# Patient Record
Sex: Male | Born: 1945 | Race: White | Hispanic: No | Marital: Single | Smoking: Never smoker
Health system: Southern US, Community
[De-identification: ages and names within clinical notes are randomized; demographics above are authoritative.]

## PROBLEM LIST (undated history)

## (undated) DIAGNOSIS — E079 Disorder of thyroid, unspecified: Secondary | ICD-10-CM

## (undated) DIAGNOSIS — I10 Essential (primary) hypertension: Secondary | ICD-10-CM

## (undated) DIAGNOSIS — E119 Type 2 diabetes mellitus without complications: Secondary | ICD-10-CM

## (undated) HISTORY — DX: Type 2 diabetes mellitus without complications: E11.9

## (undated) HISTORY — PX: OTHER SURGICAL HISTORY: SHX169

## (undated) HISTORY — DX: Disorder of thyroid, unspecified: E07.9

---

## 2013-03-09 ENCOUNTER — Other Ambulatory Visit: Payer: Self-pay

## 2013-03-09 MED ORDER — METFORMIN HCL 1000 MG PO TABS: 1000.0000 mg | ORAL_TABLET | Freq: Two times a day (BID) | ORAL | Status: DC

## 2013-03-09 NOTE — Telephone Encounter (Signed)
Last seen 07/24/12  [facility]  Last glucose leve l6/18/12

## 2013-03-22 ENCOUNTER — Ambulatory Visit (INDEPENDENT_AMBULATORY_CARE_PROVIDER_SITE_OTHER): Payer: Managed Care, Other (non HMO) | Admitting: Family Medicine

## 2013-03-22 ENCOUNTER — Encounter: Payer: Self-pay | Admitting: Family Medicine

## 2013-03-22 VITALS — BP 138/85 | HR 74 | Temp 98.0°F | Ht 68.5 in | Wt 196.4 lb

## 2013-03-22 DIAGNOSIS — I10 Essential (primary) hypertension: Secondary | ICD-10-CM

## 2013-03-22 DIAGNOSIS — E119 Type 2 diabetes mellitus without complications: Secondary | ICD-10-CM

## 2013-03-22 DIAGNOSIS — Z0000 Encounter for general adult medical examination without abnormal findings: Secondary | ICD-10-CM

## 2013-03-22 DIAGNOSIS — E039 Hypothyroidism, unspecified: Secondary | ICD-10-CM

## 2013-03-22 LAB — LIPID PANEL
Cholesterol: 134 mg/dL (ref 0–200)
HDL: 44 mg/dL (ref >39)
LDL Cholesterol: 64 mg/dL (ref 0–99)
Total CHOL/HDL Ratio: 3 Ratio
Triglycerides: 131 mg/dL (ref <150)
VLDL: 26 mg/dL (ref 0–40)

## 2013-03-22 LAB — COMPLETE METABOLIC PANEL WITH GFR
ALT: 59 U/L — ABNORMAL HIGH (ref 0–53)
AST: 45 U/L — ABNORMAL HIGH (ref 0–37)
Albumin: 4.3 g/dL (ref 3.5–5.2)
Alkaline Phosphatase: 44 U/L (ref 39–117)
BUN: 18 mg/dL (ref 6–23)
CO2: 26 mEq/L (ref 19–32)
Calcium: 9.4 mg/dL (ref 8.4–10.5)
Chloride: 106 mEq/L (ref 96–112)
Creat: 1.05 mg/dL (ref 0.50–1.35)
GFR, Est African American: 86 mL/min
GFR, Est Non African American: 75 mL/min
Glucose, Bld: 183 mg/dL — ABNORMAL HIGH (ref 70–99)
Potassium: 4.4 mEq/L (ref 3.5–5.3)
Sodium: 141 mEq/L (ref 135–145)
Total Bilirubin: 0.7 mg/dL (ref 0.3–1.2)
Total Protein: 7.5 g/dL (ref 6.0–8.3)

## 2013-03-22 LAB — POCT CBC
Granulocyte percent: 70.5 %G (ref 37–80)
HCT, POC: 41.9 % — AB (ref 43.5–53.7)
Hemoglobin: 14.4 g/dL (ref 14.1–18.1)
Lymph, poc: 1.8 (ref 0.6–3.4)
MCH, POC: 31.8 pg — AB (ref 27–31.2)
MCHC: 34.3 g/dL (ref 31.8–35.4)
MCV: 92.6 fL (ref 80–97)
MPV: 8.9 fL (ref 0–99.8)
POC Granulocyte: 5 (ref 2–6.9)
POC LYMPH PERCENT: 25.5 %L (ref 10–50)
Platelet Count, POC: 107 10*3/uL — AB (ref 142–424)
RBC: 4.5 M/uL — AB (ref 4.69–6.13)
RDW, POC: 12.7 %
WBC: 7.1 10*3/uL (ref 4.6–10.2)

## 2013-03-22 MED ORDER — GLYBURIDE 5 MG PO TABS: 5.0000 mg | ORAL_TABLET | Freq: Every day | ORAL | Status: DC

## 2013-03-22 MED ORDER — METFORMIN HCL 1000 MG PO TABS: 1000.0000 mg | ORAL_TABLET | Freq: Two times a day (BID) | ORAL | Status: DC

## 2013-03-22 MED ORDER — LEVOTHYROXINE SODIUM 50 MCG PO TABS: 50.0000 ug | ORAL_TABLET | Freq: Every day | ORAL | Status: DC

## 2013-03-22 MED ORDER — LISINOPRIL 10 MG PO TABS: 10.0000 mg | ORAL_TABLET | Freq: Every day | ORAL | Status: DC

## 2013-03-22 NOTE — Patient Instructions (Signed)

## 2013-03-22 NOTE — Progress Notes (Signed)
  Subjective:    Patient ID: Patrick [name], male    DOB: 26-Nov-1945, 67 y.o.   MRN: [id number]  HPI This 67 y.o. male presents for evaluation of routine medical exam and follow up on htn, diabetes, and hypothyroidism.  He has not Been seen in over a year and is due for CPE and CPE labs.  He is not fasting today and cannot come back fasting he states. He denies Any acute medical problems.   Review of Systems No chest pain, SOB, HA, dizziness, vision change, N/V, diarrhea, constipation, dysuria, urinary urgency or frequency, myalgias, arthralgias or rash.     Objective:   Physical Exam  Vital signs noted  Well developed well nourished male.  HEENT - Head atraumatic Normocephalic                Eyes - PERRLA, Conjuctiva - clear Sclera- Clear EOMI                Ears - EAC's Wnl TM's Wnl Gross Hearing WNL                Nose - Nares patent                 Throat - oropharanx wnl Respiratory - Lungs CTA bilateral Cardiac - RRR S1 and S2 without murmur GI - Abdomen soft Nontender and bowel sounds active x 4 Extremities - No edema. Neuro - Grossly intact.  Results for orders placed in visit on 03/22/13  POCT CBC      Result Value Range   WBC 7.1  4.6 - 10.2 K/uL   Lymph, poc 1.8  0.6 - 3.4   POC LYMPH PERCENT 25.5  10 - 50 %L   POC Granulocyte 5.0  2 - 6.9   Granulocyte percent 70.5  37 - 80 %G   RBC 4.5 (*) 4.69 - 6.13 M/uL   Hemoglobin 14.4  14.1 - 18.1 g/dL   HCT, POC 44.0 (*) 10.2 - 53.7 %   MCV 92.6  80 - 97 fL   MCH, POC 31.8 (*) 27 - 31.2 pg   MCHC 34.3  31.8 - 35.4 g/dL   RDW, POC 72.5     Platelet Count, POC 107.0 (*) 142 - 424 K/uL   MPV 8.9  0 - 99.8 fL      Assessment & Plan:  Diabetes - Plan: metFORMIN (GLUCOPHAGE) 1000 MG tablet, glyBURIDE (DIABETA) 5 MG tablet  Essential hypertension, benign - Plan: lisinopril (PRINIVIL,ZESTRIL) 10 MG tablet  Unspecified hypothyroidism - Plan: levothyroxine (SYNTHROID, LEVOTHROID) 50 MCG tablet  Routine general  medical examination at a health care facility - Plan: POCT CBC, Lipid panel, TSH, PSA, COMPLETE METABOLIC PANEL WITH GFR Thrombocytopenia - Need to watch and trend his platelets recommend follow up CBC next visit in 6 months.

## 2013-03-23 LAB — PSA: PSA: 1.18 ng/mL (ref <=4.00)

## 2013-03-23 LAB — TSH: TSH: 11.763 u[IU]/mL — ABNORMAL HIGH (ref 0.350–4.500)

## 2013-03-30 ENCOUNTER — Other Ambulatory Visit: Payer: Self-pay | Admitting: Family Medicine

## 2013-03-30 DIAGNOSIS — E119 Type 2 diabetes mellitus without complications: Secondary | ICD-10-CM

## 2013-03-30 MED ORDER — LEVOTHYROXINE SODIUM 75 MCG PO TABS: 75.0000 ug | ORAL_TABLET | Freq: Every day | ORAL | Status: DC

## 2013-03-30 NOTE — Progress Notes (Signed)
Levothyroxine sent in and hgbaic ordered

## 2013-04-01 ENCOUNTER — Other Ambulatory Visit: Payer: Self-pay | Admitting: *Deleted

## 2013-04-01 DIAGNOSIS — E119 Type 2 diabetes mellitus without complications: Secondary | ICD-10-CM

## 2013-04-01 MED ORDER — METFORMIN HCL 1000 MG PO TABS: 1000.0000 mg | ORAL_TABLET | Freq: Two times a day (BID) | ORAL | Status: DC

## 2013-04-06 ENCOUNTER — Other Ambulatory Visit: Payer: Self-pay | Admitting: Family Medicine

## 2013-04-06 DIAGNOSIS — E119 Type 2 diabetes mellitus without complications: Secondary | ICD-10-CM

## 2013-04-06 LAB — POCT GLYCOSYLATED HEMOGLOBIN (HGB A1C): Hemoglobin A1C: 9.8

## 2013-04-06 MED ORDER — GLYBURIDE 5 MG PO TABS: 10.0000 mg | ORAL_TABLET | Freq: Every day | ORAL | Status: DC

## 2013-04-06 NOTE — Addendum Note (Signed)
Addended by: Orma Render F on: 04/06/2013 10:25 AM   Modules accepted: Orders

## 2013-05-24 ENCOUNTER — Telehealth: Payer: Self-pay | Admitting: *Deleted

## 2013-05-24 NOTE — Telephone Encounter (Signed)
Pt notified of all lab results Will come back in to discuss Will call back to schedule appt

## 2013-05-24 NOTE — Telephone Encounter (Signed)
Message copied by Bearl Mulberry on Mon May 24, 2013 11:03 AM ------      Message from: Deatra Canter      Created: Tue Apr 06, 2013 12:00 PM       Hgbaic is elevated and his glyburide was increased to 10mg  or 2 5mg  tablets.  Need to follow up with Tammy clinical pharmacist for diabetic diet teaching and management. ------

## 2013-09-22 ENCOUNTER — Ambulatory Visit (INDEPENDENT_AMBULATORY_CARE_PROVIDER_SITE_OTHER): Payer: Managed Care, Other (non HMO) | Admitting: Family Medicine

## 2013-09-22 ENCOUNTER — Encounter: Payer: Self-pay | Admitting: Family Medicine

## 2013-09-22 VITALS — BP 127/79 | HR 80 | Temp 98.4°F | Ht 68.5 in | Wt 194.2 lb

## 2013-09-22 DIAGNOSIS — E119 Type 2 diabetes mellitus without complications: Secondary | ICD-10-CM

## 2013-09-22 DIAGNOSIS — Z23 Encounter for immunization: Secondary | ICD-10-CM

## 2013-09-22 DIAGNOSIS — I10 Essential (primary) hypertension: Secondary | ICD-10-CM

## 2013-09-22 DIAGNOSIS — E039 Hypothyroidism, unspecified: Secondary | ICD-10-CM

## 2013-09-22 LAB — POCT CBC
Granulocyte percent: 66.5 %G (ref 37–80)
HCT, POC: 43.4 % — AB (ref 43.5–53.7)
Hemoglobin: 14.6 g/dL (ref 14.1–18.1)
Lymph, poc: 2.2 (ref 0.6–3.4)
MCH, POC: 31.7 pg — AB (ref 27–31.2)
MCHC: 33.6 g/dL (ref 31.8–35.4)
MCV: 94.5 fL (ref 80–97)
MPV: 10 fL (ref 0–99.8)
POC Granulocyte: 4.8 (ref 2–6.9)
POC LYMPH PERCENT: 29.9 %L (ref 10–50)
Platelet Count, POC: 90 10*3/uL — AB (ref 142–424)
RBC: 4.6 M/uL — AB (ref 4.69–6.13)
RDW, POC: 13 %
WBC: 7.2 10*3/uL (ref 4.6–10.2)

## 2013-09-22 LAB — POCT GLYCOSYLATED HEMOGLOBIN (HGB A1C): Hemoglobin A1C: 9.1

## 2013-09-22 MED ORDER — LISINOPRIL 10 MG PO TABS: 10.0000 mg | ORAL_TABLET | Freq: Every day | ORAL | Status: DC

## 2013-09-22 MED ORDER — LEVOTHYROXINE SODIUM 75 MCG PO TABS: 75.0000 ug | ORAL_TABLET | Freq: Every day | ORAL | Status: DC

## 2013-09-22 MED ORDER — METFORMIN HCL 1000 MG PO TABS: 1000.0000 mg | ORAL_TABLET | Freq: Two times a day (BID) | ORAL | Status: DC

## 2013-09-22 MED ORDER — GLYBURIDE 5 MG PO TABS: 10.0000 mg | ORAL_TABLET | Freq: Every day | ORAL | Status: DC

## 2013-09-22 NOTE — Progress Notes (Signed)
   Subjective:    Patient ID: Patrick [name], male    DOB: 05-Jan-1946, 68 y.o.   MRN: [id number]  HPI This 68 y.o. male presents for evaluation of routine follow up on diabetes, hypertension, and hypothyroidism..   Review of Systems No chest pain, SOB, HA, dizziness, vision change, N/V, diarrhea, constipation, dysuria, urinary urgency or frequency, myalgias, arthralgias or rash.     Objective:   Physical Exam Vital signs noted  Well developed well nourished male.  HEENT - Head atraumatic Normocephalic                Eyes - PERRLA, Conjuctiva - clear Sclera- Clear EOMI                Ears - EAC's Wnl TM's Wnl Gross Hearing WNL                Nose - Nares patent                 Throat - oropharanx wnl Respiratory - Lungs CTA bilateral Cardiac - RRR S1 and S2 without murmur GI - Abdomen soft Nontender and bowel sounds active x 4 Extremities - No edema. Neuro - Grossly intact.       Assessment & Plan:  Diabetes - Plan: glyBURIDE (DIABETA) 5 MG tablet, metFORMIN (GLUCOPHAGE) 1000 MG tablet, POCT CBC, POCT glycosylated hemoglobin (Hb A1C), Lipid panel, PSA, total and free, CMP14+EGFR, TSH  Essential hypertension, benign - Plan: lisinopril (PRINIVIL,ZESTRIL) 10 MG tablet, Lipid panel, PSA, total and free, CMP14+EGFR, TSH  Unspecified hypothyroidism - Continue current replacement and check TSH.  Follow up 4-6 months.  Lysbeth Penner FNP

## 2013-09-22 NOTE — Patient Instructions (Signed)

## 2013-09-23 ENCOUNTER — Other Ambulatory Visit: Payer: Self-pay | Admitting: Family Medicine

## 2013-09-23 DIAGNOSIS — R7989 Other specified abnormal findings of blood chemistry: Secondary | ICD-10-CM

## 2013-09-23 DIAGNOSIS — R945 Abnormal results of liver function studies: Principal | ICD-10-CM

## 2013-09-23 LAB — PSA, TOTAL AND FREE
PSA, Free Pct: 43.3 %
PSA, Free: 0.65 ng/mL
PSA: 1.5 ng/mL (ref 0.0–4.0)

## 2013-09-23 LAB — CMP14+EGFR
ALT: 45 IU/L — ABNORMAL HIGH (ref 0–44)
AST: 44 IU/L — ABNORMAL HIGH (ref 0–40)
Albumin/Globulin Ratio: 1.7 (ref 1.1–2.5)
Albumin: 4.5 g/dL (ref 3.6–4.8)
Alkaline Phosphatase: 60 IU/L (ref 39–117)
BUN/Creatinine Ratio: 13 (ref 10–22)
BUN: 15 mg/dL (ref 8–27)
CO2: 24 mmol/L (ref 18–29)
Calcium: 9.5 mg/dL (ref 8.6–10.2)
Chloride: 99 mmol/L (ref 97–108)
Creatinine, Ser: 1.15 mg/dL (ref 0.76–1.27)
GFR calc Af Amer: 77 mL/min/{1.73_m2} (ref >59)
GFR calc non Af Amer: 66 mL/min/{1.73_m2} (ref >59)
Globulin, Total: 2.6 g/dL (ref 1.5–4.5)
Glucose: 197 mg/dL — ABNORMAL HIGH (ref 65–99)
Potassium: 4.8 mmol/L (ref 3.5–5.2)
Sodium: 139 mmol/L (ref 134–144)
Total Bilirubin: 0.7 mg/dL (ref 0.0–1.2)
Total Protein: 7.1 g/dL (ref 6.0–8.5)

## 2013-09-23 LAB — LIPID PANEL
Chol/HDL Ratio: 3.3 ratio units (ref 0.0–5.0)
Cholesterol, Total: 143 mg/dL (ref 100–199)
HDL: 43 mg/dL (ref >39)
LDL Calculated: 59 mg/dL (ref 0–99)
Triglycerides: 206 mg/dL — ABNORMAL HIGH (ref 0–149)
VLDL Cholesterol Cal: 41 mg/dL — ABNORMAL HIGH (ref 5–40)

## 2013-09-23 LAB — TSH: TSH: 12.29 u[IU]/mL — ABNORMAL HIGH (ref 0.450–4.500)

## 2013-09-23 MED ORDER — LEVOTHYROXINE SODIUM 100 MCG PO TABS: 100.0000 ug | ORAL_TABLET | Freq: Every day | ORAL | Status: DC

## 2013-09-28 ENCOUNTER — Telehealth: Payer: Self-pay | Admitting: *Deleted

## 2013-09-29 ENCOUNTER — Encounter: Payer: Self-pay | Admitting: Gastroenterology

## 2013-10-04 ENCOUNTER — Ambulatory Visit (HOSPITAL_COMMUNITY): Payer: Managed Care, Other (non HMO)

## 2013-10-07 ENCOUNTER — Ambulatory Visit (INDEPENDENT_AMBULATORY_CARE_PROVIDER_SITE_OTHER): Payer: Managed Care, Other (non HMO) | Admitting: Pharmacist

## 2013-10-07 VITALS — BP 127/72 | HR 78

## 2013-10-07 DIAGNOSIS — I10 Essential (primary) hypertension: Secondary | ICD-10-CM

## 2013-10-07 DIAGNOSIS — E1165 Type 2 diabetes mellitus with hyperglycemia: Secondary | ICD-10-CM

## 2013-10-07 DIAGNOSIS — R7989 Other specified abnormal findings of blood chemistry: Secondary | ICD-10-CM

## 2013-10-07 DIAGNOSIS — IMO0001 Reserved for inherently not codable concepts without codable children: Secondary | ICD-10-CM

## 2013-10-07 DIAGNOSIS — E1169 Type 2 diabetes mellitus with other specified complication: Secondary | ICD-10-CM | POA: Insufficient documentation

## 2013-10-07 DIAGNOSIS — E039 Hypothyroidism, unspecified: Secondary | ICD-10-CM | POA: Insufficient documentation

## 2013-10-07 DIAGNOSIS — IMO0002 Reserved for concepts with insufficient information to code with codable children: Secondary | ICD-10-CM

## 2013-10-07 DIAGNOSIS — R945 Abnormal results of liver function studies: Secondary | ICD-10-CM

## 2013-10-07 DIAGNOSIS — E1159 Type 2 diabetes mellitus with other circulatory complications: Secondary | ICD-10-CM | POA: Insufficient documentation

## 2013-10-07 MED ORDER — GLIMEPIRIDE 4 MG PO TABS: 4.0000 mg | ORAL_TABLET | Freq: Every day | ORAL | Status: DC

## 2013-10-07 NOTE — Patient Instructions (Signed)
Once finished with glyburide change to glimepiride 4mg  tablet - take 1 tablet before breakfast.   Blood glucose goals   Fasting - at least 4 hours since last eating / food = 80 to 130  Within 2 hours of last food / meal = less than 180.

## 2013-10-07 NOTE — Progress Notes (Signed)
Diabetes Follow-Up Visit Chief Complaint:   Chief Complaint  Patient presents with  . Diabetes     Filed Vitals:   10/07/13 1600  BP: 127/72  Pulse: 78     HPI: diagnosed with type 2 DM about 2 years ago.  His most recent A1c was 9.1% and was also found to have low thyroid replacement. He states that at the time of labs he had run out of one of his diabetes medications and that his BG is beginning to improve since he restarted. He is not checking his BG regularly  Current Diabetes Medications:  glyburide 5mg  2 tablets with breakfast, metformin (generic)  Exam Edema:  negative  Polyuria:  negative  Polydipsia:  negative Polyphagia:  negative  BMI:  There is no weight on file to calculate BMI.   Weight changes:  stable General Appearance:  alert, oriented, no acute distress and well nourished Mood/Affect:  normal   Low fat/carbohydrate diet?  No Nicotine Abuse?  No Medication Compliance?  Yes Exercise?  No Alcohol Abuse?  No  Home BG Monitoring:  Checking 0 times a day.  BG in office today is 93.  Lab Results  Component Value Date   HGBA1C 9.1% 09/22/2013    No results found for this basename: Derl Barrow    Lab Results  Component Value Date   CHOL 134 03/22/2013   HDL 43 09/22/2013   LDLCALC 59 09/22/2013   TRIG 206* 09/22/2013   CHOLHDL 3.3 09/22/2013      Assessment: 1.  Diabetes.  Uncontrolled but improving with medicaiton compliance 2.  Blood Pressure.  At goals todya 3.  Lipids.  LDL at goal, Tg elevatd but should imrpove with dietary changes, adequate thyroid supplementation and better BG control 4.  Elevated LFTs -  Patient was suppose to have hepatitis panel order per noted in lab results, ordered today  Recommendations: 1.  Medication recommendations at this time are as follows:  Switch from glyburide to glimepiride 4mg  1 tablet with breakfast. 2.  Reviewed HBG goals:  Fasting 80-130 and 1-2 hour post prandial <180.  Patient is instructed to  check BG 1 times per day.    3.  BP goal < 140/80. 4.  LDL goal of < 100, HDL > 40 and TG < 150. 5.  Eye Exam yearly and Dental Exam every 6 months. 6.  Dietary recommendations:  Discussed CHO serving sizes and recommended CHO amount per meal and snack. 7.  Physical Activity recommendations:  Goal of 30 minutes daily   8.  Return to clinic in 4-6 wks 9. Orders Placed This Encounter  Procedures  . Acute Hep Panel & Hep B Surface Ab      Time spent counseling patient:  40 minutes  Referring provider:  [facility]   Cherre Robins, PharmD, CPP

## 2013-10-08 ENCOUNTER — Encounter: Payer: Self-pay | Admitting: Pharmacist

## 2013-10-08 LAB — ACUTE HEP PANEL AND HEP B SURFACE AB
HCV AB: NEGATIVE
HEP B C IGM: NONREACTIVE
Hep A IgM: NONREACTIVE
Hep B S Ab: NEGATIVE
Hepatitis B Surface Ag: NEGATIVE

## 2013-10-11 ENCOUNTER — Telehealth: Payer: Self-pay | Admitting: Pharmacist

## 2013-10-11 NOTE — Telephone Encounter (Signed)
Hepatitis panel was negative.  Patient needs to keep appt with abd ultrasound.   Patient aware.

## 2013-10-19 ENCOUNTER — Ambulatory Visit: Payer: Self-pay | Admitting: Gastroenterology

## 2013-11-03 ENCOUNTER — Encounter: Payer: Self-pay | Admitting: Gastroenterology

## 2013-11-03 ENCOUNTER — Ambulatory Visit (INDEPENDENT_AMBULATORY_CARE_PROVIDER_SITE_OTHER): Payer: Managed Care, Other (non HMO) | Admitting: Gastroenterology

## 2013-11-03 ENCOUNTER — Other Ambulatory Visit: Payer: Self-pay | Admitting: Gastroenterology

## 2013-11-03 VITALS — BP 127/74 | HR 80 | Temp 97.3°F | Wt 196.8 lb

## 2013-11-03 DIAGNOSIS — R7989 Other specified abnormal findings of blood chemistry: Secondary | ICD-10-CM

## 2013-11-03 DIAGNOSIS — Z1211 Encounter for screening for malignant neoplasm of colon: Secondary | ICD-10-CM | POA: Insufficient documentation

## 2013-11-03 DIAGNOSIS — R748 Abnormal levels of other serum enzymes: Secondary | ICD-10-CM | POA: Insufficient documentation

## 2013-11-03 DIAGNOSIS — R945 Abnormal results of liver function studies: Principal | ICD-10-CM

## 2013-11-03 DIAGNOSIS — R740 Nonspecific elevation of levels of transaminase and lactic acid dehydrogenase [LDH]: Secondary | ICD-10-CM

## 2013-11-03 DIAGNOSIS — R7401 Elevation of levels of liver transaminase levels: Secondary | ICD-10-CM

## 2013-11-03 NOTE — Progress Notes (Signed)
Primary Care Physician:  Redge Gainer, MD Primary Gastroenterologist:  Dr. Gala Romney   Chief Complaint  Patient presents with  . Elevated Hepatic Enzymes    HPI:   Patrick [name] presents today at the request of Stevan Born, FNP, secondary to elevated transaminases. Hep panel negative. [place] of abdomen ordered but not completed. No abdominal pain, constipation, diarrhea. No rectal bleeding. Rare reflux usually r/t dietary intake. No dysphagia. No weight changes, good appetite. No prior colonoscopy or upper endoscopy. No IV drug use, no significant ETOH use. No prior blood transfusion, tattoos. No FH of liver disease. No prior issues with liver.  No confusion, mental status changes.   Declining colonoscopy but states he would be willing to pursue an upper endoscopy if needed in future.   Past Medical History  Diagnosis Date  . Diabetes mellitus without complication   . Thyroid disease     Past Surgical History  Procedure Laterality Date  . None      Current Outpatient Prescriptions  Medication Sig Dispense Refill  . glyBURIDE (DIABETA) 5 MG tablet Take 10 mg by mouth daily with breakfast.      . levothyroxine (SYNTHROID, LEVOTHROID) 100 MCG tablet Take 1 tablet (100 mcg total) by mouth daily.  30 tablet  11  . lisinopril (PRINIVIL,ZESTRIL) 10 MG tablet Take 1 tablet (10 mg total) by mouth daily.  90 tablet  3  . metFORMIN (GLUCOPHAGE) 1000 MG tablet Take 1 tablet (1,000 mg total) by mouth 2 (two) times daily with a meal.  180 tablet  0  . glimepiride (AMARYL) 4 MG tablet Take 1 tablet (4 mg total) by mouth daily before breakfast.  90 tablet  1   No current facility-administered medications for this visit.    Allergies as of 11/03/2013 - Review Complete 11/03/2013  Allergen Reaction Noted  . Codeine  03/22/2013    Family History  Problem Relation Age of Onset  . Diabetes Mother   . Diabetes Brother   . Colon cancer Neg Hx   . Liver disease Neg Hx     History     Social History  . Marital Status: Single    Spouse Name: N/A    Number of Children: N/A  . Years of Education: N/A   Occupational History  . Not on file.   Social History Main Topics  . Smoking status: Never Smoker   . Smokeless tobacco: Not on file  . Alcohol Use: No  . Drug Use: No  . Sexual Activity: Not on file   Other Topics Concern  . Not on file   Social History Narrative  . No narrative on file    Review of Systems: Gen: Denies any fever, chills, fatigue, weight loss, lack of appetite.  CV: Denies chest pain, heart palpitations, peripheral edema, syncope.  Resp: Denies shortness of breath at rest or with exertion. Denies wheezing or cough.  GI: see HPI GU : Denies urinary burning, urinary frequency, urinary hesitancy MS: Denies joint pain, muscle weakness, cramps, or limitation of movement.  Derm: Denies rash, itching, dry skin Psych: Denies depression, anxiety, memory loss, and confusion Heme: Denies bruising, bleeding, and enlarged lymph nodes.  Physical Exam: BP 127/74  Pulse 80  Temp(Src) 97.3 F (36.3 C) (Oral)  Wt 196 lb 12.8 oz (89.268 kg) General:   Alert and oriented. Pleasant and cooperative. Well-nourished and well-developed.  Head:  Normocephalic and atraumatic. Eyes:  Without icterus, sclera clear and conjunctiva pink.  Ears:  Normal auditory acuity. Nose:  No deformity, discharge,  or lesions. Mouth:  No deformity or lesions, oral mucosa pink.  Neck:  Supple, without mass or thyromegaly. Lungs:  Clear to auscultation bilaterally. No wheezes, rales, or rhonchi. No distress.  Heart:  S1, S2 present without murmurs appreciated.  Abdomen:  +BS, soft, non-tender and non-distended. No HSM noted. No guarding or rebound. No masses appreciated.  Rectal:  Deferred  Msk:  Symmetrical without gross deformities. Normal posture. Extremities:  Without clubbing or edema. Neurologic:  Alert and  oriented x4;  grossly normal neurologically. Skin:  Intact  without significant lesions or rashes. Cervical Nodes:  No significant cervical adenopathy. Psych:  Alert and cooperative. Normal mood and affect.  Lab Results  Component Value Date   HEPAIGM NON REACTIVE 10/07/2013   HEPBIGM NON REACTIVE 10/07/2013   Lab Results  Component Value Date   ALT 45* 09/22/2013   AST 44* 09/22/2013   ALKPHOS 60 09/22/2013   BILITOT 0.7 09/22/2013   Lab Results  Component Value Date   WBC 7.2 09/22/2013   HGB 14.6 09/22/2013   HCT 43.4* 09/22/2013   MCV 94.5 09/22/2013    Platelet count 90

## 2013-11-03 NOTE — Patient Instructions (Signed)
We have scheduled you for an ultrasound of your liver.   I would like to have your liver numbers rechecked in about 3 months! We will send you a reminder for this.  Please follow a low fat diet and set a goal to lose 10 lbs in the next 3 months.   Further recommendations to follow!

## 2013-11-03 NOTE — Assessment & Plan Note (Signed)
68 year old male with very mildly elevated transaminases without any known prior liver disease. Completely asymptomatic. However, thrombocytopenia noted with most recent platelet count 90. Albumin normal. Question of occult liver disease, likely dealing with fatty liver. Needs [place] of abdomen for further evaluation. Discussed dietary and behavior modification in detail. If evidence of cirrhosis, will need EGD for variceal screening. Recommend repeating LFTs in 3 months. If no evidence of cirrhosis, needs to be referred to hematology due to thrombocytopenia.

## 2013-11-03 NOTE — Assessment & Plan Note (Signed)
Declining lower GI evaluation despite discussion of importance for colorectal screening. No lower GI symptoms.

## 2013-11-04 NOTE — Progress Notes (Signed)
cc'd to pcp 

## 2013-11-05 ENCOUNTER — Other Ambulatory Visit: Payer: Self-pay

## 2013-11-05 DIAGNOSIS — R7401 Elevation of levels of liver transaminase levels: Secondary | ICD-10-CM

## 2013-11-05 DIAGNOSIS — R740 Nonspecific elevation of levels of transaminase and lactic acid dehydrogenase [LDH]: Principal | ICD-10-CM

## 2013-11-09 ENCOUNTER — Ambulatory Visit (HOSPITAL_COMMUNITY)
Admission: RE | Admit: 2013-11-09 | Discharge: 2013-11-09 | Disposition: A | Discharge status: Home / Self Care | Payer: Managed Care, Other (non HMO) | Source: Ambulatory Visit | Attending: Gastroenterology | Admitting: Gastroenterology

## 2013-11-09 DIAGNOSIS — R945 Abnormal results of liver function studies: Secondary | ICD-10-CM

## 2013-11-09 DIAGNOSIS — R7989 Other specified abnormal findings of blood chemistry: Secondary | ICD-10-CM | POA: Insufficient documentation

## 2013-11-09 DIAGNOSIS — K7689 Other specified diseases of liver: Secondary | ICD-10-CM | POA: Insufficient documentation

## 2013-11-09 DIAGNOSIS — R160 Hepatomegaly, not elsewhere classified: Secondary | ICD-10-CM | POA: Insufficient documentation

## 2013-11-24 NOTE — Progress Notes (Signed)
Quick Note:  Fatty liver.  No definite evidence of cirrhosis, but he may be a good candidate for elastography for further evaluation. Spleen is normal sized. Thrombocytopenia noted, which made me question a possible underlying liver disease originally. I think we should go ahead and refer him to Hematology due to low platelets. I will talk with Dr. Gala Romney about elastography. ______

## 2013-12-15 NOTE — Progress Notes (Signed)
Quick Note:  Let's obtain ultrasound elastography.  If this is normal or no concerning evidence for cirrhosis, my plan will be to refer to hematology. ______

## 2013-12-16 ENCOUNTER — Other Ambulatory Visit: Payer: Self-pay | Admitting: Gastroenterology

## 2013-12-16 DIAGNOSIS — R160 Hepatomegaly, not elsewhere classified: Secondary | ICD-10-CM

## 2013-12-17 ENCOUNTER — Encounter: Payer: Self-pay | Admitting: Internal Medicine

## 2013-12-21 ENCOUNTER — Encounter: Payer: Self-pay | Admitting: *Deleted

## 2013-12-21 ENCOUNTER — Ambulatory Visit (HOSPITAL_COMMUNITY): Payer: Managed Care, Other (non HMO)

## 2013-12-23 ENCOUNTER — Ambulatory Visit (INDEPENDENT_AMBULATORY_CARE_PROVIDER_SITE_OTHER): Payer: Managed Care, Other (non HMO) | Admitting: Pharmacist

## 2013-12-23 ENCOUNTER — Other Ambulatory Visit: Payer: Self-pay | Admitting: Gastroenterology

## 2013-12-23 VITALS — BP 130/62 | HR 70 | Ht 68.5 in | Wt 200.0 lb

## 2013-12-23 DIAGNOSIS — R748 Abnormal levels of other serum enzymes: Secondary | ICD-10-CM

## 2013-12-23 DIAGNOSIS — R740 Nonspecific elevation of levels of transaminase and lactic acid dehydrogenase [LDH]: Secondary | ICD-10-CM

## 2013-12-23 DIAGNOSIS — D696 Thrombocytopenia, unspecified: Secondary | ICD-10-CM

## 2013-12-23 DIAGNOSIS — E039 Hypothyroidism, unspecified: Secondary | ICD-10-CM

## 2013-12-23 DIAGNOSIS — IMO0002 Reserved for concepts with insufficient information to code with codable children: Secondary | ICD-10-CM

## 2013-12-23 DIAGNOSIS — E119 Type 2 diabetes mellitus without complications: Secondary | ICD-10-CM

## 2013-12-23 DIAGNOSIS — R7401 Elevation of levels of liver transaminase levels: Secondary | ICD-10-CM

## 2013-12-23 DIAGNOSIS — I10 Essential (primary) hypertension: Secondary | ICD-10-CM

## 2013-12-23 DIAGNOSIS — IMO0001 Reserved for inherently not codable concepts without codable children: Secondary | ICD-10-CM

## 2013-12-23 DIAGNOSIS — E1165 Type 2 diabetes mellitus with hyperglycemia: Secondary | ICD-10-CM

## 2013-12-23 LAB — POCT GLYCOSYLATED HEMOGLOBIN (HGB A1C): HEMOGLOBIN A1C: 6.9

## 2013-12-23 MED ORDER — METFORMIN HCL 1000 MG PO TABS: 1000.0000 mg | ORAL_TABLET | Freq: Two times a day (BID) | ORAL | Status: DC

## 2013-12-23 NOTE — Progress Notes (Signed)
Diabetes Follow-Up Visit Chief Complaint:   Chief Complaint  Patient presents with  . Diabetes     Filed Vitals:   12/23/13 1648  BP: 130/62  Pulse: 70     HPI: Patient was first seen by clinical pharmacist about 2 months ago.  He was initially diagnosed with type 2 DM about 2 years ago.  His last recent A1c was 9.1% 09/22/2013 and was also found to have low thyroid replacement. He states that at the time of labs he had run out of one of his diabetes medications and that his BG is beginning to improve since he restarted. He is not checking his BG regularly  Current Diabetes Medications:  glyburide 81m 2 tablets with breakfast, metformin (generic) Patient is suppose to switch from glyburide to glimepiride 449m1 tablet with breakfast when out of glyburide but hasn't made change yet.  Exam Edema:  negative  Polyuria:  negative  Polydipsia:  negative Polyphagia:  negative  BMI:  Body mass index is 29.96 kg/(m^2).   Weight changes:  Increased about 3# since last visit General Appearance:  alert, oriented, no acute distress and well nourished Mood/Affect:  normal   Low fat/carbohydrate diet?  Yes Nicotine Abuse?  No Medication Compliance?  Yes Exercise?  No Alcohol Abuse?  No  Home BG Monitoring:  Checking 0 times a day - states that the glucometer he has is too complicated.    Lab Results  Component Value Date   HGBA1C 6.9 12/23/2013    No results found for this basename: MIDerl Barrow  Lab Results  Component Value Date   CHOL 134 03/22/2013   HDL 43 09/22/2013   LDLCALC 59 09/22/2013   TRIG 206* 09/22/2013   CHOLHDL 3.3 09/22/2013      Assessment: 1.  Diabetes.  Uncontrolled but improved - A1c was 6.9% today 2.  Blood Pressure.  At goal today 3.  Lipids.  LDL at goal, Tg elevated at last check - should be improved since BG better controlled 4.  Elevated LFTs  - abdominal USKoreahowed fatty liver 5.  Hypothyroidism - need to recheck TSH  today  Recommendations: 1.  Medication recommendations at this time are as follows:  No changes 2.  Reviewed HBG goals:  Fasting 80-130 and 1-2 hour post prandial <180.  Patient is instructed to check BG 1 times per day.   Gave new glucometer and taught to use 0 Contour Next - but not sure will be covered on his insurance - patient to check and let me know. 3.  BP goal < 140/80. 4.  LDL goal of < 100, HDL > 40 and TG < 150. 5.  Eye Exam yearly and Dental Exam every 6 months. 6.  Dietary recommendations:  Discussed diet briefly - continue to limit high CHO foods and sugar. 7.  Physical Activity recommendations:  Goal of 30 minutes daily   8.  [facility] Orders Placed This Encounter  Procedures  . CMP14+EGFR  . Thyroid Panel With TSH  . POCT glycosylated hemoglobin (Hb A1C)     Time spent counseling patient:  40 minutes  Referring provider:  [facility]   TaCherre RobinsPharmD, CPP

## 2013-12-23 NOTE — Progress Notes (Signed)
Quick Note:  Has he been referred to Hematology due to thrombocytopenia ______

## 2013-12-24 LAB — CMP14+EGFR
ALT: 44 IU/L (ref 0–44)
AST: 34 IU/L (ref 0–40)
Albumin/Globulin Ratio: 1.5 (ref 1.1–2.5)
Albumin: 4.3 g/dL (ref 3.6–4.8)
Alkaline Phosphatase: 47 IU/L (ref 39–117)
BUN/Creatinine Ratio: 22 (ref 10–22)
BUN: 26 mg/dL (ref 8–27)
CALCIUM: 9.2 mg/dL (ref 8.6–10.2)
CO2: 26 mmol/L (ref 18–29)
CREATININE: 1.17 mg/dL (ref 0.76–1.27)
Chloride: 104 mmol/L (ref 97–108)
GFR calc Af Amer: 75 mL/min/{1.73_m2} (ref >59)
GFR calc non Af Amer: 65 mL/min/{1.73_m2} (ref >59)
GLOBULIN, TOTAL: 2.9 g/dL (ref 1.5–4.5)
Glucose: 72 mg/dL (ref 65–99)
Potassium: 4.5 mmol/L (ref 3.5–5.2)
SODIUM: 144 mmol/L (ref 134–144)
Total Bilirubin: 0.6 mg/dL (ref 0.0–1.2)
Total Protein: 7.2 g/dL (ref 6.0–8.5)

## 2013-12-26 DIAGNOSIS — D696 Thrombocytopenia, unspecified: Secondary | ICD-10-CM | POA: Insufficient documentation

## 2013-12-27 ENCOUNTER — Ambulatory Visit: Payer: Self-pay

## 2013-12-29 ENCOUNTER — Telehealth: Payer: Self-pay | Admitting: Pharmacist

## 2013-12-29 NOTE — Telephone Encounter (Signed)
Lab results from last week were al normal.  No changes in medications at this time.

## 2013-12-30 LAB — SPECIMEN STATUS REPORT

## 2013-12-30 NOTE — Telephone Encounter (Signed)
Patient is aware 

## 2014-01-04 LAB — SPECIMEN STATUS REPORT

## 2014-01-04 LAB — THYROID PANEL WITH TSH
Free Thyroxine Index: 1.3 (ref 1.2–4.9)
T3 UPTAKE RATIO: 26 % (ref 24–39)
T4 TOTAL: 4.9 ug/dL (ref 4.5–12.0)
TSH: 5.64 u[IU]/mL — ABNORMAL HIGH (ref 0.450–4.500)

## 2014-01-05 ENCOUNTER — Telehealth: Payer: Self-pay | Admitting: Pharmacist

## 2014-01-05 MED ORDER — LEVOTHYROXINE SODIUM 112 MCG PO TABS: 112.0000 ug | ORAL_TABLET | Freq: Every day | ORAL | Status: DC

## 2014-01-05 NOTE — Telephone Encounter (Signed)
TSH still slightly elevated but improved.  Recommend increase levothyroxine to 121mcg daily. Recheck thyroid panel in 6 weeks.

## 2014-01-06 NOTE — Telephone Encounter (Signed)
Patient notified of thyroid results and that Rx was sent to [facility].

## 2014-01-10 ENCOUNTER — Ambulatory Visit (HOSPITAL_COMMUNITY): Payer: Managed Care, Other (non HMO)

## 2014-01-10 ENCOUNTER — Other Ambulatory Visit (HOSPITAL_COMMUNITY): Payer: Managed Care, Other (non HMO)

## 2014-01-11 NOTE — Progress Notes (Signed)
This encounter was created in error - please disregard.

## 2014-01-17 ENCOUNTER — Other Ambulatory Visit: Payer: Self-pay

## 2014-01-17 DIAGNOSIS — R7401 Elevation of levels of liver transaminase levels: Secondary | ICD-10-CM

## 2014-01-17 DIAGNOSIS — R740 Nonspecific elevation of levels of transaminase and lactic acid dehydrogenase [LDH]: Principal | ICD-10-CM

## 2014-02-03 ENCOUNTER — Ambulatory Visit: Payer: Managed Care, Other (non HMO) | Admitting: Gastroenterology

## 2014-02-03 ENCOUNTER — Telehealth: Payer: Self-pay | Admitting: Gastroenterology

## 2014-02-03 NOTE — Telephone Encounter (Signed)
Please send a note

## 2014-02-03 NOTE — Telephone Encounter (Signed)
Pt was a no show

## 2014-02-04 ENCOUNTER — Encounter: Payer: Self-pay | Admitting: Gastroenterology

## 2014-02-04 NOTE — Telephone Encounter (Signed)
Mailed letter °

## 2014-02-23 ENCOUNTER — Encounter: Payer: Self-pay | Admitting: Internal Medicine

## 2014-03-22 ENCOUNTER — Ambulatory Visit: Payer: Managed Care, Other (non HMO) | Admitting: Family Medicine

## 2014-03-23 ENCOUNTER — Encounter: Payer: Self-pay | Admitting: Family Medicine

## 2014-03-23 ENCOUNTER — Ambulatory Visit (INDEPENDENT_AMBULATORY_CARE_PROVIDER_SITE_OTHER): Payer: Managed Care, Other (non HMO) | Admitting: Family Medicine

## 2014-03-23 VITALS — BP 108/78 | HR 75 | Temp 97.5°F | Ht 68.5 in | Wt 195.0 lb

## 2014-03-23 DIAGNOSIS — I10 Essential (primary) hypertension: Secondary | ICD-10-CM

## 2014-03-23 DIAGNOSIS — E039 Hypothyroidism, unspecified: Secondary | ICD-10-CM

## 2014-03-23 DIAGNOSIS — E139 Other specified diabetes mellitus without complications: Secondary | ICD-10-CM

## 2014-03-23 DIAGNOSIS — E119 Type 2 diabetes mellitus without complications: Secondary | ICD-10-CM

## 2014-03-23 MED ORDER — METFORMIN HCL 1000 MG PO TABS: 1000.0000 mg | ORAL_TABLET | Freq: Two times a day (BID) | ORAL | Status: DC

## 2014-03-23 MED ORDER — LISINOPRIL 10 MG PO TABS: 10.0000 mg | ORAL_TABLET | Freq: Every day | ORAL | Status: DC

## 2014-03-23 MED ORDER — GLIMEPIRIDE 4 MG PO TABS: 4.0000 mg | ORAL_TABLET | Freq: Every day | ORAL | Status: DC

## 2014-03-23 MED ORDER — LEVOTHYROXINE SODIUM 112 MCG PO TABS: 112.0000 ug | ORAL_TABLET | Freq: Every day | ORAL | Status: DC

## 2014-03-23 NOTE — Progress Notes (Signed)
   Subjective:    Patient ID: Patrick [name], male    DOB: 09/26/1945, 68 y.o.   MRN: [id number]  HPI  This 68 y.o. male presents for evaluation of follow up on HTN, hyperlipidemia, DM, and hypothyroidism.  He ran out of his diabetes meds and hypothyroid medicine.  He needs refills.  Review of Systems    No chest pain, SOB, HA, dizziness, vision change, N/V, diarrhea, constipation, dysuria, urinary urgency or frequency, myalgias, arthralgias or rash.  Objective:   Physical Exam   Vital signs noted  Well developed well nourished male.  HEENT - Head atraumatic Normocephalic                Eyes - PERRLA, Conjuctiva - clear Sclera- Clear EOMI                Ears - EAC's Wnl TM's Wnl Gross Hearing WNL                Nose - Nares patent                 Throat - oropharanx wnl Respiratory - Lungs CTA bilateral Cardiac - RRR S1 and S2 without murmur GI - Abdomen soft Nontender and bowel sounds active x 4 Extremities - No edema. Neuro - Grossly intact.     Assessment & Plan:  Essential hypertension, benign - Plan: lisinopril (PRINIVIL,ZESTRIL) 10 MG tablet  Other specified diabetes mellitus without complications - Plan: glimepiride (AMARYL) 4 MG tablet, metFORMIN (GLUCOPHAGE) 1000 MG tablet  Unspecified hypothyroidism - Plan: levothyroxine (SYNTHROID) 112 MCG tablet  Declines labs today and will get next visit.  Follow up in 6 months  Lysbeth Penner FNP

## 2014-04-26 ENCOUNTER — Ambulatory Visit (INDEPENDENT_AMBULATORY_CARE_PROVIDER_SITE_OTHER): Payer: Managed Care, Other (non HMO) | Admitting: Family Medicine

## 2014-04-26 VITALS — BP 138/72 | HR 84 | Temp 97.0°F | Ht 68.5 in | Wt 191.2 lb

## 2014-04-26 DIAGNOSIS — L03314 Cellulitis of groin: Secondary | ICD-10-CM

## 2014-04-26 DIAGNOSIS — L0390 Cellulitis, unspecified: Secondary | ICD-10-CM

## 2014-04-26 DIAGNOSIS — N481 Balanitis: Secondary | ICD-10-CM

## 2014-04-26 DIAGNOSIS — L0291 Cutaneous abscess, unspecified: Secondary | ICD-10-CM

## 2014-04-26 DIAGNOSIS — L03319 Cellulitis of trunk, unspecified: Secondary | ICD-10-CM

## 2014-04-26 DIAGNOSIS — L02219 Cutaneous abscess of trunk, unspecified: Secondary | ICD-10-CM

## 2014-04-26 MED ORDER — CEFTRIAXONE SODIUM 1 G IJ SOLR
1.0000 g | Freq: Once | INTRAMUSCULAR | Status: AC
  Administered 2014-04-26: 1 g via INTRAMUSCULAR

## 2014-04-26 MED ORDER — ONDANSETRON 4 MG PO TBDP: 4.0000 mg | ORAL_TABLET | Freq: Three times a day (TID) | ORAL | Status: DC | PRN

## 2014-04-26 MED ORDER — NYSTATIN 100000 UNIT/GM EX CREA: 1.0000 "application " | TOPICAL_CREAM | Freq: Two times a day (BID) | CUTANEOUS | Status: DC

## 2014-04-26 NOTE — Progress Notes (Signed)
   Subjective:    Patient ID: Patrick [name], male    DOB: 03-Jan-1946, 68 y.o.   MRN: [id number]  HPI C/o bump on his left groin for 4 days.  He went to urgent care and was rx'd bactrim DS 2 po bid for 10 days and advised to apply warm compress.  He has been having worsening swelling and a "knot" in his left groin that has popped open and has been draining.  He has been nauseated.  He has a rash on his penis.   Review of Systems    No chest pain, SOB, HA, dizziness, vision change, N/V, diarrhea, constipation, dysuria, urinary urgency or frequency, myalgias, arthralgias or rash.  Objective:   Physical Exam Vital signs noted  Well developed well nourished male.  HEENT - Head atraumatic Normocephalic Respiratory - Lungs CTA bilateral Cardiac - RRR S1 and S2 without murmur GI - Abdomen soft Nontender and bowel sounds active x 4 Skin - Left groin and inguinal area with erythema and swelling and induration approx. 6cm x 4cm           Left groin with opening draining purulence.  Scrotum with erythema and swelling.  Penis with balanitis.    Procedure - Left groin abcess injected with 3 cc's lidocaine with epi and then once adequate anesthesia is acquired then stab incision is made where opening of drainage and induration and  Approximately 20 cc's of serous sanguin purulence is drained.  Dressing is applied.     Assessment & Plan:  Cellulitis of groin - Plan: ondansetron (ZOFRAN ODT) 4 MG disintegrating tablet, cefTRIAXone (ROCEPHIN) injection 1 g, Aerobic culture. 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  He understands that by doing I&D and tx outpatient might not work and he may need to go to hospital anyway.   Incision and drainage today.  Follow up tomorrow. Discussed with patient that he should cut back on bactrim DS to one po bid 1 gram rocephin IM  given  Balanitis - Plan: nystatin cream (MYCOSTATIN)  Cellulitis and abscess - Plan: cefTRIAXone (ROCEPHIN) injection 1 g, Aerobic culture

## 2014-04-28 ENCOUNTER — Ambulatory Visit (INDEPENDENT_AMBULATORY_CARE_PROVIDER_SITE_OTHER): Payer: Managed Care, Other (non HMO) | Admitting: Family Medicine

## 2014-04-28 ENCOUNTER — Encounter: Payer: Self-pay | Admitting: Family Medicine

## 2014-04-28 VITALS — BP 141/84 | HR 77 | Temp 97.0°F | Ht 68.5 in | Wt 188.0 lb

## 2014-04-28 DIAGNOSIS — L03319 Cellulitis of trunk, unspecified: Secondary | ICD-10-CM

## 2014-04-28 DIAGNOSIS — L03314 Cellulitis of groin: Secondary | ICD-10-CM

## 2014-04-28 DIAGNOSIS — L02219 Cutaneous abscess of trunk, unspecified: Secondary | ICD-10-CM

## 2014-04-28 DIAGNOSIS — L0390 Cellulitis, unspecified: Secondary | ICD-10-CM

## 2014-04-28 DIAGNOSIS — L0291 Cutaneous abscess, unspecified: Secondary | ICD-10-CM

## 2014-04-28 LAB — AEROBIC CULTURE

## 2014-04-28 MED ORDER — CEFTRIAXONE SODIUM 1 G IJ SOLR
1.0000 g | Freq: Once | INTRAMUSCULAR | Status: AC
  Administered 2014-04-28: 1 g via INTRAMUSCULAR

## 2014-04-28 NOTE — Progress Notes (Signed)
   Subjective:    Patient ID: Patrick [name], male    DOB: Aug 01, 1946, 68 y.o.   MRN: [id number]  HPI Follow up on left groin cellulitis.  He is feeling better.   Review of Systems C/o cellulitis left groin   No chest pain, SOB, HA, dizziness, vision change, N/V, diarrhea, constipation, dysuria, urinary urgency or frequency, myalgias, arthralgias or rash.  Objective:   Physical Exam  Left groin erythema is in inguinal region, incision with purulence draining.  Dressing is saturated with Dry serous sanguin drainage.  Inguinal area with induration.      Assessment & Plan:  Cellulitis of groin - Plan: cefTRIAXone (ROCEPHIN) injection 1 g  Cellulitis and abscess - Plan: cefTRIAXone (ROCEPHIN) injection 1 g  4x4 Dressing applied and taped secure  Wound looks a lot better and recommend follow up in 4 days.  Lysbeth Penner FNP

## 2014-05-02 ENCOUNTER — Ambulatory Visit (INDEPENDENT_AMBULATORY_CARE_PROVIDER_SITE_OTHER): Payer: Managed Care, Other (non HMO) | Admitting: Family Medicine

## 2014-05-02 VITALS — BP 115/70 | HR 82 | Temp 99.8°F | Ht 68.5 in | Wt 191.0 lb

## 2014-05-02 DIAGNOSIS — L02219 Cutaneous abscess of trunk, unspecified: Secondary | ICD-10-CM

## 2014-05-02 DIAGNOSIS — L03319 Cellulitis of trunk, unspecified: Secondary | ICD-10-CM

## 2014-05-02 DIAGNOSIS — L03314 Cellulitis of groin: Secondary | ICD-10-CM

## 2014-05-02 NOTE — Progress Notes (Signed)
   Subjective:    Patient ID: Patrick [name], male    DOB: 21-Jan-1946, 68 y.o.   MRN: [id number]  HPI  Follow up on left groin cellulitis.  He has been taking bactrim DS and this is getting better.  He is not having as much drainage.  Review of Systems No chest pain, SOB, HA, dizziness, vision change, N/V, diarrhea, constipation, dysuria, urinary urgency or frequency, myalgias, arthralgias or rash.     Objective:   Physical Exam  Skin - Left inguinal region with incision draining purulence and no induration or erythema. New bandaid dressing applied.      Assessment & Plan:  Cellulitis left groin - Continue Bactrim DS one po bid.  He has about 14 tablets left.  Follow up prn  Lysbeth Penner FNP

## 2014-07-19 ENCOUNTER — Encounter: Payer: Self-pay | Admitting: *Deleted

## 2014-08-17 ENCOUNTER — Telehealth: Payer: Self-pay | Admitting: Family Medicine

## 2014-08-17 NOTE — Telephone Encounter (Signed)
appt scheduled for 1/27 with Sabra Heck.

## 2014-09-23 ENCOUNTER — Ambulatory Visit: Payer: Managed Care, Other (non HMO) | Admitting: Family Medicine

## 2014-09-28 ENCOUNTER — Ambulatory Visit: Payer: Managed Care, Other (non HMO) | Admitting: Family Medicine

## 2014-10-07 ENCOUNTER — Ambulatory Visit: Payer: Managed Care, Other (non HMO) | Admitting: Family

## 2015-05-23 ENCOUNTER — Other Ambulatory Visit: Payer: Self-pay | Admitting: Family Medicine

## 2015-05-24 NOTE — Telephone Encounter (Signed)
Last seen 04/28/14 B [place]  Last thryoid 12/24/13

## 2015-07-07 ENCOUNTER — Other Ambulatory Visit: Payer: Self-pay | Admitting: Family Medicine

## 2015-07-07 NOTE — Telephone Encounter (Signed)
Patient has not been seen since 05/03/2015. Please advise

## 2015-07-10 NOTE — Telephone Encounter (Signed)
No voice mail.   Letter was mailed requesting patient to schedule an appointment to be assessed and have lab work done.  This will be necessary to continue getting prescriptions filled.

## 2015-07-10 NOTE — Telephone Encounter (Signed)
PT needs follow up appt. No more refills!!!

## 2015-08-13 ENCOUNTER — Other Ambulatory Visit: Payer: Self-pay | Admitting: Family

## 2015-08-14 ENCOUNTER — Other Ambulatory Visit: Payer: Self-pay | Admitting: Family

## 2015-08-16 ENCOUNTER — Encounter: Payer: Self-pay | Admitting: Family Medicine

## 2015-08-16 ENCOUNTER — Ambulatory Visit (INDEPENDENT_AMBULATORY_CARE_PROVIDER_SITE_OTHER): Payer: Managed Care, Other (non HMO) | Admitting: Family Medicine

## 2015-08-16 VITALS — BP 141/90 | HR 91 | Temp 98.2°F | Ht 68.5 in | Wt 194.0 lb

## 2015-08-16 DIAGNOSIS — I10 Essential (primary) hypertension: Secondary | ICD-10-CM | POA: Diagnosis not present

## 2015-08-16 DIAGNOSIS — IMO0001 Reserved for inherently not codable concepts without codable children: Secondary | ICD-10-CM

## 2015-08-16 DIAGNOSIS — E038 Other specified hypothyroidism: Secondary | ICD-10-CM | POA: Diagnosis not present

## 2015-08-16 DIAGNOSIS — E139 Other specified diabetes mellitus without complications: Secondary | ICD-10-CM

## 2015-08-16 DIAGNOSIS — E1165 Type 2 diabetes mellitus with hyperglycemia: Secondary | ICD-10-CM | POA: Diagnosis not present

## 2015-08-16 LAB — POCT GLYCOSYLATED HEMOGLOBIN (HGB A1C): HEMOGLOBIN A1C: 11.4

## 2015-08-16 LAB — GLUCOSE, POCT (MANUAL RESULT ENTRY): POC Glucose: 273 mg/dl — AB (ref 70–99)

## 2015-08-16 MED ORDER — GLIMEPIRIDE 4 MG PO TABS: 4.0000 mg | ORAL_TABLET | Freq: Every day | ORAL | Status: DC

## 2015-08-16 MED ORDER — METFORMIN HCL 1000 MG PO TABS: ORAL_TABLET | ORAL | Status: DC

## 2015-08-16 MED ORDER — LISINOPRIL 10 MG PO TABS: 10.0000 mg | ORAL_TABLET | Freq: Every day | ORAL | Status: DC

## 2015-08-16 MED ORDER — LEVOTHYROXINE SODIUM 112 MCG PO TABS: ORAL_TABLET | ORAL | Status: DC

## 2015-08-16 NOTE — Addendum Note (Signed)
Addended by: Earlene Plater on: 08/16/2015 04:49 PM   Modules accepted: Miquel Dunn

## 2015-08-16 NOTE — Progress Notes (Signed)
   Subjective:    Patient ID: Patrick [name], male    DOB: February 26, 1946, 69 y.o.   MRN: [id number]  HPI 69 year old male with a history of diabetes and hypertension and hypothyroidism. He has run out of his pills having not been seen in some time. He tells me he ran out about 3 days ago. He checks his sugar occasionally at home and by history has not been elevated.    Review of Systems  Constitutional: Negative.   HENT: Negative.   Respiratory: Negative.   Cardiovascular: Negative.   Genitourinary: Negative.   Psychiatric/Behavioral: Negative.       BP 141/90 mmHg  Pulse 91  Temp(Src) 98.2 F (36.8 C) (Oral)  Ht 5' 8.5" (1.74 m)  Wt 194 lb (87.998 kg)  BMI 29.07 kg/m2  Objective:   Physical Exam  Constitutional: He is oriented to person, place, and time. He appears well-developed and well-nourished.  Cardiovascular: Normal rate, regular rhythm and intact distal pulses.   Pulmonary/Chest: Breath sounds normal.  Musculoskeletal: Normal range of motion.  Neurological: He is alert and oriented to person, place, and time.  Psychiatric: He has a normal mood and affect. His behavior is normal.          Assessment & Plan:  1. Essential hypertension We'll refill lisinopril. Blood pressure is up some today but he ran out of pills 3 days ago - BMP8+EGFR  2. Other specified hypothyroidism Continue same dose of Synthroid pending result of TSH - TSH  3. Uncontrolled type 2 diabetes mellitus without complication, without long-term current use of insulin (HCC) A new Amaryl and metformin pending A1c - POCT glycosylated hemoglobin (Hb A1C)  Wardell Honour MD

## 2015-08-16 NOTE — Addendum Note (Signed)
Addended by: Earlene Plater on: 08/16/2015 04:51 PM   Modules accepted: Orders, SmartSet

## 2015-08-17 LAB — BMP8+EGFR
BUN / CREAT RATIO: 11 (ref 10–22)
BUN: 13 mg/dL (ref 8–27)
CO2: 25 mmol/L (ref 18–29)
CREATININE: 1.2 mg/dL (ref 0.76–1.27)
Calcium: 8.9 mg/dL (ref 8.6–10.2)
Chloride: 100 mmol/L (ref 96–106)
GFR calc non Af Amer: 62 mL/min/{1.73_m2} (ref >59)
GFR, EST AFRICAN AMERICAN: 72 mL/min/{1.73_m2} (ref >59)
GLUCOSE: 293 mg/dL — AB (ref 65–99)
Potassium: 3.7 mmol/L (ref 3.5–5.2)
Sodium: 140 mmol/L (ref 134–144)

## 2015-08-17 LAB — TSH: TSH: 6.04 u[IU]/mL — AB (ref 0.450–4.500)

## 2015-09-17 DIAGNOSIS — L02416 Cutaneous abscess of left lower limb: Secondary | ICD-10-CM | POA: Diagnosis not present

## 2015-09-17 DIAGNOSIS — M79605 Pain in left leg: Secondary | ICD-10-CM | POA: Diagnosis not present

## 2015-09-17 DIAGNOSIS — E119 Type 2 diabetes mellitus without complications: Secondary | ICD-10-CM | POA: Diagnosis not present

## 2015-09-18 DIAGNOSIS — L02416 Cutaneous abscess of left lower limb: Secondary | ICD-10-CM | POA: Diagnosis not present

## 2015-09-18 DIAGNOSIS — R030 Elevated blood-pressure reading, without diagnosis of hypertension: Secondary | ICD-10-CM | POA: Diagnosis not present

## 2015-09-20 DIAGNOSIS — Z4800 Encounter for change or removal of nonsurgical wound dressing: Secondary | ICD-10-CM | POA: Diagnosis not present

## 2015-09-22 ENCOUNTER — Encounter: Payer: Self-pay | Admitting: Family Medicine

## 2015-09-22 ENCOUNTER — Ambulatory Visit (INDEPENDENT_AMBULATORY_CARE_PROVIDER_SITE_OTHER): Payer: Medicare Other | Admitting: Family Medicine

## 2015-09-22 VITALS — BP 144/81 | HR 74 | Temp 97.5°F | Ht 68.5 in | Wt 195.0 lb

## 2015-09-22 DIAGNOSIS — L02416 Cutaneous abscess of left lower limb: Secondary | ICD-10-CM

## 2015-09-22 NOTE — Progress Notes (Signed)
   Subjective:    Patient ID: Patrick [name], male    DOB: 10/28/1945, 70 y.o.   MRN: [id number]  HPI  Patient here today for follow up from urgent care where he was seen last Sunday for a skin infection. This is located on his left outer thigh. Abscess was drained and packing was inserted and then removed 48 hours later. Patient was started on Bactrim. There is still some drainage from the wound. He has not been using warm compresses     Patient Active Problem List   Diagnosis Date Noted  . Thrombocytopenia ([place]) 12/26/2013  . Abnormal transaminases 11/03/2013  . Encounter for screening colonoscopy 11/03/2013  . Type 2 diabetes mellitus, uncontrolled ([place]) 10/07/2013  . Hypertension 10/07/2013  . Hypothyroidism 10/07/2013   Outpatient Encounter Prescriptions as of 09/22/2015  Medication Sig  . glimepiride (AMARYL) 4 MG tablet Take 1 tablet (4 mg total) by mouth daily before breakfast.  . levothyroxine (SYNTHROID, LEVOTHROID) 112 MCG tablet TAKE ONE TABLET BY MOUTH ONCE DAILY BEFORE BREAKFAST  . lisinopril (PRINIVIL,ZESTRIL) 10 MG tablet Take 1 tablet (10 mg total) by mouth daily.  . metFORMIN (GLUCOPHAGE) 1000 MG tablet TAKE ONE TABLET BY MOUTH TWICE DAILY WITH  A  MEAL  . sulfamethoxazole-trimethoprim (BACTRIM DS,SEPTRA DS) 800-160 MG tablet Take 1 tablet by mouth 4 (four) times daily.   No facility-administered encounter medications on file as of 09/22/2015.      Review of Systems  Constitutional: Negative.   HENT: Negative.   Eyes: Negative.   Respiratory: Negative.   Cardiovascular: Negative.   Gastrointestinal: Negative.   Endocrine: Negative.   Genitourinary: Negative.   Musculoskeletal: Negative.   Skin: Negative.        Left outer thigh boil/ cyst  Allergic/Immunologic: Negative.   Neurological: Negative.   Hematological: Negative.   Psychiatric/Behavioral: Negative.        Objective:   Physical Exam  Constitutional: He appears well-developed and  well-nourished.  Skin:  Abscess is still open with slight drainage which was cultured. There is still some induration suggesting there is more purulent material within the abscess.   BP 144/81 mmHg  Pulse 74  Temp(Src) 97.5 F (36.4 C) (Oral)  Ht 5' 8.5" (1.74 m)  Wt 195 lb (88.451 kg)  BMI 29.21 kg/m2        Assessment & Plan:  1. Abscess of leg, left Use warm compresses or heat at least 2-4 times a day. We'll continue with Bactrim pending result of culture taken today. - Anaerobic and Aerobic Culture  Wardell Honour MD

## 2015-09-26 LAB — ANAEROBIC AND AEROBIC CULTURE

## 2015-11-16 ENCOUNTER — Ambulatory Visit (INDEPENDENT_AMBULATORY_CARE_PROVIDER_SITE_OTHER): Payer: Managed Care, Other (non HMO) | Admitting: Family Medicine

## 2015-11-16 ENCOUNTER — Encounter: Payer: Self-pay | Admitting: Family Medicine

## 2015-11-16 VITALS — BP 132/82 | HR 89 | Temp 97.9°F | Ht 68.5 in | Wt 196.4 lb

## 2015-11-16 DIAGNOSIS — D696 Thrombocytopenia, unspecified: Secondary | ICD-10-CM

## 2015-11-16 DIAGNOSIS — I10 Essential (primary) hypertension: Secondary | ICD-10-CM

## 2015-11-16 DIAGNOSIS — E1165 Type 2 diabetes mellitus with hyperglycemia: Secondary | ICD-10-CM | POA: Diagnosis not present

## 2015-11-16 DIAGNOSIS — IMO0001 Reserved for inherently not codable concepts without codable children: Secondary | ICD-10-CM

## 2015-11-16 LAB — CBC WITH DIFFERENTIAL/PLATELET
BASOS ABS: 0 10*3/uL (ref 0.0–0.2)
Basos: 1 %
EOS (ABSOLUTE): 0.1 10*3/uL (ref 0.0–0.4)
Eos: 2 %
HEMATOCRIT: 40.7 % (ref 37.5–51.0)
Hemoglobin: 13.5 g/dL (ref 12.6–17.7)
Immature Grans (Abs): 0 10*3/uL (ref 0.0–0.1)
Immature Granulocytes: 0 %
LYMPHS ABS: 2.1 10*3/uL (ref 0.7–3.1)
Lymphs: 28 %
MCH: 30.8 pg (ref 26.6–33.0)
MCHC: 33.2 g/dL (ref 31.5–35.7)
MCV: 93 fL (ref 79–97)
MONOS ABS: 0.6 10*3/uL (ref 0.1–0.9)
Monocytes: 8 %
NEUTROS PCT: 61 %
Neutrophils Absolute: 4.6 10*3/uL (ref 1.4–7.0)
PLATELETS: 133 10*3/uL — AB (ref 150–379)
RBC: 4.39 x10E6/uL (ref 4.14–5.80)
RDW: 13.5 % (ref 12.3–15.4)
WBC: 7.4 10*3/uL (ref 3.4–10.8)

## 2015-11-16 LAB — BAYER DCA HB A1C WAIVED: HB A1C: 9.3 % — AB (ref <7.0)

## 2015-11-16 NOTE — Progress Notes (Signed)
Patient ID: Patrick [name], male   DOB: December 07, 1945, 70 y.o.   MRN: [id number]  Primary Physician: Wardell Honour, MD  Chief Complaint: 70.-year-old gentleman here to follow-up diabetes and hypertension. He takes metformin and glimepiride but admits that his diet is not very good. Last A1c was 11.4. Blood pressures and weight have been stable. Lipids were at goal when last checked about 2 years ago he denies any tingling burning in his feet and legs     Past Medical History  Diagnosis Date  . Diabetes mellitus without complication ([place])   . Thyroid disease      Home Meds: Prior to Admission medications   Medication Sig Start Date End Date Taking? Authorizing Provider  glimepiride (AMARYL) 4 MG tablet Take 1 tablet (4 mg total) by mouth daily before breakfast. 08/16/15  Yes Wardell Honour, MD  levothyroxine (SYNTHROID, LEVOTHROID) 112 MCG tablet TAKE ONE TABLET BY MOUTH ONCE DAILY BEFORE BREAKFAST 08/16/15  Yes Wardell Honour, MD  lisinopril (PRINIVIL,ZESTRIL) 10 MG tablet Take 1 tablet (10 mg total) by mouth daily. 08/16/15  Yes Wardell Honour, MD  metFORMIN (GLUCOPHAGE) 1000 MG tablet TAKE ONE TABLET BY MOUTH TWICE DAILY WITH  A  MEAL 08/16/15  Yes Wardell Honour, MD    Allergies:  Allergies  Allergen Reactions  . Codeine     Social History   Social History  . Marital Status: Single    Spouse Name: N/A  . Number of Children: N/A  . Years of Education: N/A   Occupational History  . Not on file.   Social History Main Topics  . Smoking status: Never Smoker   . Smokeless tobacco: Not on file  . Alcohol Use: No  . Drug Use: No  . Sexual Activity: Not on file   Other Topics Concern  . Not on file   Social History Narrative     Review of Systems: Constitutional: negative for chills, fever, night sweats, weight changes, or fatigue  HEENT: negative for vision changes, hearing loss, congestion, rhinorrhea, ST, epistaxis, or sinus pressure Cardiovascular:  negative for chest pain or palpitations Respiratory: negative for hemoptysis, wheezing, shortness of breath, or cough Abdominal: negative for abdominal pain, nausea, vomiting, diarrhea, or constipation Dermatological: negative for rash Neurologic: negative for headache, dizziness, or syncope All other systems reviewed and are otherwise negative with the exception to those above and in the HPI.   Physical Exam: Blood pressure 132/82, pulse 89, temperature 97.9 F (36.6 C), temperature source Oral, height 5' 8.5" (1.74 m), weight 196 lb 6.4 oz (89.086 kg)., Body mass index is 29.42 kg/(m^2). General: Well developed, well nourished, in no acute distress. Head: Normocephalic, atraumatic, eyes without discharge, sclera non-icteric, nares are without discharge. Bilateral auditory canals clear, TM's are without perforation, pearly grey and translucent with reflective cone of light bilaterally. Oral cavity moist, posterior pharynx without exudate, erythema, peritonsillar abscess, or post nasal drip.  Neck: Supple. No thyromegaly. Full ROM. No lymphadenopathy. Lungs: Clear bilaterally to auscultation without wheezes, rales, or rhonchi. Breathing is unlabored. Heart: RRR with S1 S2. No murmurs, rubs, or gallops appreciated. Abdomen: Soft, non-tender, non-distended with normoactive bowel sounds. No hepatomegaly. No rebound/guarding. No obvious abdominal masses. Msk:  Strength and tone normal for age. Extremities/Skin: Warm and dry. No clubbing or cyanosis. No edema. No rashes or suspicious lesions. Pulses are palpable in his feet Neuro: Alert and oriented X 3. Moves all extremities spontaneously. Gait is normal. CNII-XII grossly in tact. Psych:  Responds to  questions appropriately with a normal affect.   Labs:   ASSESSMENT AND PLAN:   1. Essential hypertension Pressure is well controlled on lisinopril. Continue same dose - CMP14+EGFR - Lipid panel  2. Uncontrolled type 2 diabetes mellitus  without complication, without long-term current use of insulin ([place]) As noted above A1c's are not doing good will see where we are with A1c today may need to make some changes - Bayer DCA Hb A1c Waived  3. Thrombocytopenia ([place]) Check CBC in addition - Lipid panel  Wardell Honour MD 11/16/2015 3:48 PM

## 2015-11-16 NOTE — Patient Instructions (Signed)
Thank you for allowing us to care for you today. We strive to provide exceptional quality and compassionate care. Please let us know how we are doing and how we can help serve you better by filling out the survey that you receive from [employer].     

## 2015-11-17 LAB — CMP14+EGFR
A/G RATIO: 1.4 (ref 1.2–2.2)
ALT: 41 IU/L (ref 0–44)
AST: 37 IU/L (ref 0–40)
Albumin: 4.3 g/dL (ref 3.6–4.8)
Alkaline Phosphatase: 60 IU/L (ref 39–117)
BUN/Creatinine Ratio: 14 (ref 10–22)
BUN: 17 mg/dL (ref 8–27)
Bilirubin Total: 0.8 mg/dL (ref 0.0–1.2)
CALCIUM: 9.7 mg/dL (ref 8.6–10.2)
CO2: 25 mmol/L (ref 18–29)
Chloride: 98 mmol/L (ref 96–106)
Creatinine, Ser: 1.24 mg/dL (ref 0.76–1.27)
GFR calc Af Amer: 69 mL/min/{1.73_m2} (ref >59)
GFR, EST NON AFRICAN AMERICAN: 60 mL/min/{1.73_m2} (ref >59)
Globulin, Total: 3 g/dL (ref 1.5–4.5)
Glucose: 227 mg/dL — ABNORMAL HIGH (ref 65–99)
POTASSIUM: 4.8 mmol/L (ref 3.5–5.2)
Sodium: 139 mmol/L (ref 134–144)
Total Protein: 7.3 g/dL (ref 6.0–8.5)

## 2015-11-17 LAB — LIPID PANEL
CHOL/HDL RATIO: 3 ratio (ref 0.0–5.0)
CHOLESTEROL TOTAL: 136 mg/dL (ref 100–199)
HDL: 46 mg/dL (ref >39)
LDL Calculated: 54 mg/dL (ref 0–99)
TRIGLYCERIDES: 179 mg/dL — AB (ref 0–149)
VLDL Cholesterol Cal: 36 mg/dL (ref 5–40)

## 2015-11-21 ENCOUNTER — Encounter: Payer: Self-pay | Admitting: *Deleted

## 2016-02-05 ENCOUNTER — Encounter: Payer: Self-pay | Admitting: Family Medicine

## 2016-02-05 ENCOUNTER — Ambulatory Visit (INDEPENDENT_AMBULATORY_CARE_PROVIDER_SITE_OTHER): Payer: Medicare Other | Admitting: Family Medicine

## 2016-02-05 VITALS — BP 134/87 | HR 82 | Temp 97.3°F | Ht 68.5 in | Wt 193.4 lb

## 2016-02-05 DIAGNOSIS — L97509 Non-pressure chronic ulcer of other part of unspecified foot with unspecified severity: Secondary | ICD-10-CM | POA: Diagnosis not present

## 2016-02-05 DIAGNOSIS — E11621 Type 2 diabetes mellitus with foot ulcer: Secondary | ICD-10-CM | POA: Diagnosis not present

## 2016-02-05 DIAGNOSIS — L03032 Cellulitis of left toe: Secondary | ICD-10-CM

## 2016-02-05 MED ORDER — SULFAMETHOXAZOLE-TRIMETHOPRIM 800-160 MG PO TABS: 1.0000 | ORAL_TABLET | Freq: Two times a day (BID) | ORAL | Status: DC

## 2016-02-05 NOTE — Progress Notes (Signed)
BP 134/87 mmHg  Pulse 82  Temp(Src) 97.3 F (36.3 C) (Oral)  Ht 5' 8.5" (1.74 m)  Wt 193 lb 6.4 oz (87.726 kg)  BMI 28.98 kg/m2   Subjective:    Patient ID: Patrick [name], male    DOB: 01/20/1946, 70 y.o.   MRN: [id number]  HPI: Patrick [name] is a 70 y.o. male presenting on 02/05/2016 for Possible infection of toes   HPI Toe infection Patient comes in for foot and toe pain that has been going on for the past week. He denies any drainage or fevers or chills. He has abnormal overlapping of toes and this has led to the irritation. He has significant pain between the 2nd and 4th toes.  Relevant past medical, surgical, family and social history reviewed and updated as indicated. Interim medical history since our last visit reviewed. Allergies and medications reviewed and updated.  Review of Systems  Constitutional: Negative for fever.  HENT: Negative for ear discharge and ear pain.   Eyes: Negative for discharge and visual disturbance.  Respiratory: Negative for shortness of breath and wheezing.   Cardiovascular: Negative for chest pain and leg swelling.  Gastrointestinal: Negative for abdominal pain, diarrhea and constipation.  Genitourinary: Negative for difficulty urinating.  Musculoskeletal: Negative for back pain and gait problem.  Skin: Positive for color change and wound. Negative for rash.  Neurological: Negative for syncope, light-headedness and headaches.  All other systems reviewed and are negative.   Per HPI unless specifically indicated above     Medication List       This list is accurate as of: 02/05/16  9:32 AM.  Always use your most recent med list.               glimepiride 4 MG tablet  Commonly known as:  AMARYL  Take 1 tablet (4 mg total) by mouth daily before breakfast.     levothyroxine 112 MCG tablet  Commonly known as:  SYNTHROID, LEVOTHROID  TAKE ONE TABLET BY MOUTH ONCE DAILY BEFORE BREAKFAST     lisinopril 10 MG tablet  Commonly  known as:  PRINIVIL,ZESTRIL  Take 1 tablet (10 mg total) by mouth daily.     metFORMIN 1000 MG tablet  Commonly known as:  GLUCOPHAGE  TAKE ONE TABLET BY MOUTH TWICE DAILY WITH  A  MEAL     sulfamethoxazole-trimethoprim 800-160 MG tablet  Commonly known as:  BACTRIM DS,SEPTRA DS  Take 1 tablet by mouth 2 (two) times daily.           Objective:    BP 134/87 mmHg  Pulse 82  Temp(Src) 97.3 F (36.3 C) (Oral)  Ht 5' 8.5" (1.74 m)  Wt 193 lb 6.4 oz (87.726 kg)  BMI 28.98 kg/m2  Wt Readings from Last 3 Encounters:  02/05/16 193 lb 6.4 oz (87.726 kg)  11/16/15 196 lb 6.4 oz (89.086 kg)  09/22/15 195 lb (88.451 kg)    Physical Exam  Constitutional: He is oriented to person, place, and time. He appears well-developed and well-nourished. No distress.  Eyes: Conjunctivae and EOM are normal. Pupils are equal, round, and reactive to light. Right eye exhibits no discharge. No scleral icterus.  Neck: Neck supple. No thyromegaly present.  Cardiovascular: Normal rate, regular rhythm, normal heart sounds and intact distal pulses.   No murmur heard. Pulmonary/Chest: Effort normal and breath sounds normal. No respiratory distress. He has no wheezes.  Musculoskeletal: Normal range of motion. He exhibits no edema.  Lymphadenopathy:    He  has no cervical adenopathy.  Neurological: He is alert and oriented to person, place, and time. Coordination normal.  Skin: Skin is warm and dry. Lesion (patient has ulceration under 2nd,3rd and 4th toes on left foot. ulceration limited to skin breakdown. tender and has surrounding erythema) noted. No rash noted. He is not diaphoretic. There is erythema.  Psychiatric: He has a normal mood and affect. His behavior is normal.  Nursing note and vitals reviewed.     Assessment & Plan:   Problem List Items Addressed This Visit    None    Visit Diagnoses    Type 2 diabetes mellitus with foot ulcer, without long-term current use of insulin ([place])    -  Primary      Relevant Medications    sulfamethoxazole-trimethoprim (BACTRIM DS,SEPTRA DS) 800-160 MG tablet    Cellulitis of toe of left foot        Relevant Medications    sulfamethoxazole-trimethoprim (BACTRIM DS,SEPTRA DS) 800-160 MG tablet       Follow up plan: Return in about 1 week (around 02/12/2016), or if symptoms worsen or fail to improve, for Foot infection recheck.  Counseling provided for all of the vaccine components No orders of the defined types were placed in this encounter.    Patrick Pina, MD [facility] 02/05/2016, 9:32 AM

## 2016-02-18 DIAGNOSIS — B353 Tinea pedis: Secondary | ICD-10-CM | POA: Diagnosis not present

## 2016-02-18 DIAGNOSIS — M2012 Hallux valgus (acquired), left foot: Secondary | ICD-10-CM | POA: Diagnosis not present

## 2016-02-18 DIAGNOSIS — E119 Type 2 diabetes mellitus without complications: Secondary | ICD-10-CM | POA: Diagnosis not present

## 2016-02-18 DIAGNOSIS — S91302A Unspecified open wound, left foot, initial encounter: Secondary | ICD-10-CM | POA: Diagnosis not present

## 2016-02-18 DIAGNOSIS — L03032 Cellulitis of left toe: Secondary | ICD-10-CM | POA: Diagnosis not present

## 2016-02-22 ENCOUNTER — Encounter: Payer: Self-pay | Admitting: Family Medicine

## 2016-02-22 ENCOUNTER — Ambulatory Visit (INDEPENDENT_AMBULATORY_CARE_PROVIDER_SITE_OTHER): Payer: Medicare Other | Admitting: Family Medicine

## 2016-02-22 VITALS — BP 148/92 | HR 81 | Temp 97.2°F | Ht 68.5 in | Wt 189.2 lb

## 2016-02-22 DIAGNOSIS — E139 Other specified diabetes mellitus without complications: Secondary | ICD-10-CM

## 2016-02-22 DIAGNOSIS — E038 Other specified hypothyroidism: Secondary | ICD-10-CM

## 2016-02-22 DIAGNOSIS — E1165 Type 2 diabetes mellitus with hyperglycemia: Secondary | ICD-10-CM | POA: Diagnosis not present

## 2016-02-22 DIAGNOSIS — L03032 Cellulitis of left toe: Secondary | ICD-10-CM | POA: Diagnosis not present

## 2016-02-22 DIAGNOSIS — L02419 Cutaneous abscess of limb, unspecified: Secondary | ICD-10-CM

## 2016-02-22 DIAGNOSIS — L03119 Cellulitis of unspecified part of limb: Secondary | ICD-10-CM | POA: Diagnosis not present

## 2016-02-22 DIAGNOSIS — I10 Essential (primary) hypertension: Secondary | ICD-10-CM | POA: Diagnosis not present

## 2016-02-22 DIAGNOSIS — L02612 Cutaneous abscess of left foot: Secondary | ICD-10-CM

## 2016-02-22 DIAGNOSIS — IMO0001 Reserved for inherently not codable concepts without codable children: Secondary | ICD-10-CM

## 2016-02-22 LAB — BAYER DCA HB A1C WAIVED: HB A1C: 10.7 % — AB (ref <7.0)

## 2016-02-22 MED ORDER — TERBINAFINE HCL 250 MG PO TABS: 250.0000 mg | ORAL_TABLET | Freq: Every day | ORAL | Status: DC

## 2016-02-22 MED ORDER — METFORMIN HCL 1000 MG PO TABS
ORAL_TABLET | ORAL | Status: DC
Start: 2016-02-22 — End: 2016-09-16

## 2016-02-22 MED ORDER — GLIMEPIRIDE 4 MG PO TABS: 4.0000 mg | ORAL_TABLET | Freq: Every day | ORAL | Status: DC

## 2016-02-22 MED ORDER — LISINOPRIL 10 MG PO TABS: 10.0000 mg | ORAL_TABLET | Freq: Every day | ORAL | Status: DC

## 2016-02-22 NOTE — Progress Notes (Signed)
   Subjective:    Patient ID: Patrick [name], male    DOB: [birth year], 70 y.o.   MRN: [id number]  HPI 70 year old gentleman who is here to follow-up cellulitis in his foot. Apparently started as athlete's foot but the skin is broken down now at with a bacterial infection. He was given sulfa here but when it did not seem to be effective he was seen in urgent care. There he was started on antifungal cream nightly, as well as clindamycin 300 mg 3 times a day. Testing of blood sugars at home has been elevated. Sugar was around 200 today.  Patient Active Problem List   Diagnosis Date Noted  . Thrombocytopenia ([place]) 12/26/2013  . Abnormal transaminases 11/03/2013  . Encounter for screening colonoscopy 11/03/2013  . Type 2 diabetes mellitus, uncontrolled ([place]) 10/07/2013  . Hypertension 10/07/2013  . Hypothyroidism 10/07/2013   Outpatient Encounter Prescriptions as of 02/22/2016  Medication Sig  . clindamycin (CLEOCIN) 300 MG capsule Take 300 mg by mouth 3 (three) times daily.  Marland Kitchen glimepiride (AMARYL) 4 MG tablet Take 1 tablet (4 mg total) by mouth daily before breakfast.  . levothyroxine (SYNTHROID, LEVOTHROID) 112 MCG tablet TAKE ONE TABLET BY MOUTH ONCE DAILY BEFORE BREAKFAST  . lisinopril (PRINIVIL,ZESTRIL) 10 MG tablet Take 1 tablet (10 mg total) by mouth daily.  . metFORMIN (GLUCOPHAGE) 1000 MG tablet TAKE ONE TABLET BY MOUTH TWICE DAILY WITH  A  MEAL  . [DISCONTINUED] sulfamethoxazole-trimethoprim (BACTRIM DS,SEPTRA DS) 800-160 MG tablet Take 1 tablet by mouth 2 (two) times daily.   No facility-administered encounter medications on file as of 02/22/2016.      Review of Systems  Constitutional: Negative.   Respiratory: Negative.   Cardiovascular: Negative.   Gastrointestinal: Negative.   Skin: Positive for color change.       Objective:   Physical Exam  Constitutional: He appears well-developed and well-nourished.  Cardiovascular: Normal rate and regular rhythm.     Pulmonary/Chest: Effort normal.  Skin:  Skin on the foot is red and peeling the surrounding erythema. I do think this is related to fungal infection now complicated by bacterial infection   BP 148/92 mmHg  Pulse 81  Temp(Src) 97.2 F (36.2 C) (Oral)  Ht 5' 8.5" (1.74 m)  Wt 189 lb 3.2 oz (85.821 kg)  BMI 28.35 kg/m2        Assessment & Plan:  1. Uncontrolled type 2 diabetes mellitus without complication, without long-term current use of insulin ([place]) Asked A1c was markedly elevated at 11.4. Meds include metformin and glimepiride - Bayer DCA Hb A1c Waived  2. Essential hypertension, benign Blood pressure is fairly well controlled on lisinopril - CMP14+EGFR - lisinopril (PRINIVIL,ZESTRIL) 10 MG tablet; Take 1 tablet (10 mg total) by mouth daily.  Dispense: 90 tablet; Refill: 1  3. Other specified diabetes mellitus without complications ([place]) See above - glimepiride (AMARYL) 4 MG tablet; Take 1 tablet (4 mg total) by mouth daily before breakfast.  Dispense: 90 tablet; Refill: 1  4. Other specified hypothyroidism TSH was not checked after dose was increased - TSH  5. Cellulitis and abscess of leg, except foot IContinue , clindamycin, soaks with Epsom salts. Add Lamisil f    Wardell Honour MD

## 2016-02-23 ENCOUNTER — Ambulatory Visit: Payer: Medicare Other | Admitting: Family Medicine

## 2016-02-23 LAB — CMP14+EGFR
A/G RATIO: 1.3 (ref 1.2–2.2)
ALBUMIN: 3.8 g/dL (ref 3.6–4.8)
ALT: 41 IU/L (ref 0–44)
AST: 39 IU/L (ref 0–40)
Alkaline Phosphatase: 62 IU/L (ref 39–117)
BUN/Creatinine Ratio: 13 (ref 10–24)
BUN: 16 mg/dL (ref 8–27)
Bilirubin Total: 0.6 mg/dL (ref 0.0–1.2)
CALCIUM: 9.2 mg/dL (ref 8.6–10.2)
CO2: 22 mmol/L (ref 18–29)
Chloride: 99 mmol/L (ref 96–106)
Creatinine, Ser: 1.19 mg/dL (ref 0.76–1.27)
GFR, EST AFRICAN AMERICAN: 73 mL/min/{1.73_m2} (ref >59)
GFR, EST NON AFRICAN AMERICAN: 63 mL/min/{1.73_m2} (ref >59)
GLOBULIN, TOTAL: 2.9 g/dL (ref 1.5–4.5)
Glucose: 207 mg/dL — ABNORMAL HIGH (ref 65–99)
POTASSIUM: 4 mmol/L (ref 3.5–5.2)
Sodium: 139 mmol/L (ref 134–144)
TOTAL PROTEIN: 6.7 g/dL (ref 6.0–8.5)

## 2016-02-23 LAB — TSH: TSH: 9.5 u[IU]/mL — ABNORMAL HIGH (ref 0.450–4.500)

## 2016-02-26 ENCOUNTER — Other Ambulatory Visit: Payer: Self-pay | Admitting: *Deleted

## 2016-02-26 MED ORDER — LEVOTHYROXINE SODIUM 125 MCG PO TABS: 125.0000 ug | ORAL_TABLET | Freq: Every day | ORAL | Status: DC

## 2016-02-28 ENCOUNTER — Telehealth: Payer: Self-pay | Admitting: *Deleted

## 2016-02-28 MED ORDER — GABAPENTIN 300 MG PO CAPS: 300.0000 mg | ORAL_CAPSULE | Freq: Two times a day (BID) | ORAL | Status: DC

## 2016-02-28 NOTE — Telephone Encounter (Signed)
Pt is having continued pain and redness Per Dr Sabra Heck try Gabapentin 300mg  hs x  1wk Then BID Pt verbalizes understanding RX sent into pharmacy

## 2016-03-07 ENCOUNTER — Ambulatory Visit (INDEPENDENT_AMBULATORY_CARE_PROVIDER_SITE_OTHER): Payer: Medicare Other | Admitting: Family Medicine

## 2016-03-07 ENCOUNTER — Encounter: Payer: Self-pay | Admitting: Family Medicine

## 2016-03-07 ENCOUNTER — Telehealth: Payer: Self-pay | Admitting: Family Medicine

## 2016-03-07 VITALS — BP 142/85 | HR 80 | Temp 97.7°F | Ht 68.5 in | Wt 186.2 lb

## 2016-03-07 DIAGNOSIS — L03032 Cellulitis of left toe: Secondary | ICD-10-CM | POA: Diagnosis not present

## 2016-03-07 DIAGNOSIS — I10 Essential (primary) hypertension: Secondary | ICD-10-CM

## 2016-03-07 DIAGNOSIS — IMO0001 Reserved for inherently not codable concepts without codable children: Secondary | ICD-10-CM

## 2016-03-07 DIAGNOSIS — E1165 Type 2 diabetes mellitus with hyperglycemia: Secondary | ICD-10-CM | POA: Diagnosis not present

## 2016-03-07 MED ORDER — MUPIROCIN 2 % EX OINT: TOPICAL_OINTMENT | CUTANEOUS | Status: DC

## 2016-03-07 NOTE — Telephone Encounter (Signed)
Spoke with pt regarding referral Informed pt that it will take a couple of days to get appt scheduled Pt verbalizes understanding

## 2016-03-07 NOTE — Progress Notes (Signed)
   Subjective:    Patient ID: Patrick [name], male    DOB: November 23, 1945, 70 y.o.   MRN: [id number]  HPI patient is here to follow-up with infection on his foot. He is diabetic. I think he had fungal infection that was in complicated by bacterial infection fifth. He has finished his clindamycin. The second and third toes are inflamed  appearing and this is worrisome. I do not think there is infection remaining.  Patient Active Problem List   Diagnosis Date Noted  . Thrombocytopenia ([place]) 12/26/2013  . Abnormal transaminases 11/03/2013  . Encounter for screening colonoscopy 11/03/2013  . Type 2 diabetes mellitus, uncontrolled ([place]) 10/07/2013  . Hypertension 10/07/2013  . Hypothyroidism 10/07/2013   Outpatient Encounter Prescriptions as of 03/07/2016  Medication Sig  . gabapentin (NEURONTIN) 300 MG capsule Take 1 capsule (300 mg total) by mouth 2 (two) times daily.  Marland Kitchen glimepiride (AMARYL) 4 MG tablet Take 1 tablet (4 mg total) by mouth daily before breakfast.  . levothyroxine (SYNTHROID, LEVOTHROID) 125 MCG tablet Take 1 tablet (125 mcg total) by mouth daily.  Marland Kitchen lisinopril (PRINIVIL,ZESTRIL) 10 MG tablet Take 1 tablet (10 mg total) by mouth daily.  . metFORMIN (GLUCOPHAGE) 1000 MG tablet TAKE ONE TABLET BY MOUTH TWICE DAILY WITH  A  MEAL  . [DISCONTINUED] clindamycin (CLEOCIN) 300 MG capsule Take 300 mg by mouth 3 (three) times daily.  . [DISCONTINUED] terbinafine (LAMISIL) 250 MG tablet Take 1 tablet (250 mg total) by mouth daily.   No facility-administered encounter medications on file as of 03/07/2016.      Review of Systems  Constitutional: Negative.   HENT: Negative.   Respiratory: Negative.   Cardiovascular: Negative.   Skin: Positive for wound.       Objective:   Physical Exam  Constitutional: He appears well-developed and well-nourished.  Skin:  Toes appear raw. There is no cellulitis. I am concerned about healing of the toes given his diabetes. Will use Bactroban and  dressing for now but would like for him to see doctors at the wound and foot Center. Hopefully we can avoid further breakdown infection and amputation   BP 142/85 mmHg  Pulse 80  Temp(Src) 97.7 F (36.5 C) (Oral)  Ht 5' 8.5" (1.74 m)  Wt 186 lb 3.2 oz (84.46 kg)  BMI 27.90 kg/m2       Assessment & Plan:  1. Uncontrolled type 2 diabetes mellitus without complication, without long-term current use of insulin ([place]) Continue same medicines as before  Would like referral to wound center to help take care of toe wounds  2. Essential hypertension Continue same medicines  Wardell Honour MD

## 2016-03-07 NOTE — Addendum Note (Signed)
Addended by: Jamelle Haring on: 03/07/2016 01:59 PM   Modules accepted: Orders

## 2016-03-19 DIAGNOSIS — E1140 Type 2 diabetes mellitus with diabetic neuropathy, unspecified: Secondary | ICD-10-CM | POA: Diagnosis not present

## 2016-03-19 DIAGNOSIS — L97529 Non-pressure chronic ulcer of other part of left foot with unspecified severity: Secondary | ICD-10-CM | POA: Diagnosis not present

## 2016-03-19 DIAGNOSIS — E039 Hypothyroidism, unspecified: Secondary | ICD-10-CM | POA: Diagnosis not present

## 2016-03-19 DIAGNOSIS — L97512 Non-pressure chronic ulcer of other part of right foot with fat layer exposed: Secondary | ICD-10-CM | POA: Diagnosis not present

## 2016-03-19 DIAGNOSIS — E11621 Type 2 diabetes mellitus with foot ulcer: Secondary | ICD-10-CM | POA: Diagnosis not present

## 2016-03-19 DIAGNOSIS — Z885 Allergy status to narcotic agent status: Secondary | ICD-10-CM | POA: Diagnosis not present

## 2016-03-19 DIAGNOSIS — I10 Essential (primary) hypertension: Secondary | ICD-10-CM | POA: Diagnosis not present

## 2016-03-19 DIAGNOSIS — Z79899 Other long term (current) drug therapy: Secondary | ICD-10-CM | POA: Diagnosis not present

## 2016-03-19 DIAGNOSIS — Z7984 Long term (current) use of oral hypoglycemic drugs: Secondary | ICD-10-CM | POA: Diagnosis not present

## 2016-03-25 DIAGNOSIS — E785 Hyperlipidemia, unspecified: Secondary | ICD-10-CM | POA: Diagnosis not present

## 2016-03-25 DIAGNOSIS — E11621 Type 2 diabetes mellitus with foot ulcer: Secondary | ICD-10-CM | POA: Diagnosis not present

## 2016-03-25 DIAGNOSIS — L97529 Non-pressure chronic ulcer of other part of left foot with unspecified severity: Secondary | ICD-10-CM | POA: Diagnosis not present

## 2016-03-25 DIAGNOSIS — I10 Essential (primary) hypertension: Secondary | ICD-10-CM | POA: Diagnosis not present

## 2016-04-02 DIAGNOSIS — L97512 Non-pressure chronic ulcer of other part of right foot with fat layer exposed: Secondary | ICD-10-CM | POA: Diagnosis not present

## 2016-04-02 DIAGNOSIS — E11621 Type 2 diabetes mellitus with foot ulcer: Secondary | ICD-10-CM | POA: Diagnosis not present

## 2016-04-02 DIAGNOSIS — L97529 Non-pressure chronic ulcer of other part of left foot with unspecified severity: Secondary | ICD-10-CM | POA: Diagnosis not present

## 2016-04-16 DIAGNOSIS — L97512 Non-pressure chronic ulcer of other part of right foot with fat layer exposed: Secondary | ICD-10-CM | POA: Diagnosis not present

## 2016-04-16 DIAGNOSIS — L97529 Non-pressure chronic ulcer of other part of left foot with unspecified severity: Secondary | ICD-10-CM | POA: Diagnosis not present

## 2016-04-16 DIAGNOSIS — E11621 Type 2 diabetes mellitus with foot ulcer: Secondary | ICD-10-CM | POA: Diagnosis not present

## 2016-04-24 DIAGNOSIS — L97529 Non-pressure chronic ulcer of other part of left foot with unspecified severity: Secondary | ICD-10-CM | POA: Diagnosis not present

## 2016-04-24 DIAGNOSIS — L97512 Non-pressure chronic ulcer of other part of right foot with fat layer exposed: Secondary | ICD-10-CM | POA: Diagnosis not present

## 2016-04-24 DIAGNOSIS — E11621 Type 2 diabetes mellitus with foot ulcer: Secondary | ICD-10-CM | POA: Diagnosis not present

## 2016-05-01 DIAGNOSIS — L97512 Non-pressure chronic ulcer of other part of right foot with fat layer exposed: Secondary | ICD-10-CM | POA: Diagnosis not present

## 2016-05-01 DIAGNOSIS — L97529 Non-pressure chronic ulcer of other part of left foot with unspecified severity: Secondary | ICD-10-CM | POA: Diagnosis not present

## 2016-05-01 DIAGNOSIS — E11621 Type 2 diabetes mellitus with foot ulcer: Secondary | ICD-10-CM | POA: Diagnosis not present

## 2016-05-08 ENCOUNTER — Encounter: Payer: Self-pay | Admitting: Family Medicine

## 2016-05-08 ENCOUNTER — Ambulatory Visit (INDEPENDENT_AMBULATORY_CARE_PROVIDER_SITE_OTHER): Payer: Medicare Other | Admitting: Family Medicine

## 2016-05-08 VITALS — BP 138/80 | HR 73 | Temp 97.8°F | Ht 68.5 in | Wt 195.0 lb

## 2016-05-08 DIAGNOSIS — H60392 Other infective otitis externa, left ear: Secondary | ICD-10-CM | POA: Diagnosis not present

## 2016-05-08 DIAGNOSIS — R251 Tremor, unspecified: Secondary | ICD-10-CM | POA: Diagnosis not present

## 2016-05-08 MED ORDER — NEOMYCIN-POLYMYXIN-HC 3.5-10000-1 OT SOLN: 3.0000 [drp] | Freq: Four times a day (QID) | OTIC | 0 refills | Status: DC

## 2016-05-08 NOTE — Progress Notes (Signed)
Subjective:    Patient ID: Patrick [name], male    DOB: Mar 14, 1946, 70 y.o.   MRN: [id number]  HPI Patient here today for left ear pain. He denies any congestion or fever. He denies drainage. He denies pain with chewing. Hearing seems normal.  He also has some other concerns today which include question of memory loss some tremor and change in handwriting. On the surfaces would suggest Parkinson's disease. There is no family history of tremor or Parkinson's. He has not had any problems with gait or balance.    Patient Active Problem List   Diagnosis Date Noted  . Thrombocytopenia ([place]) 12/26/2013  . Abnormal transaminases 11/03/2013  . Encounter for screening colonoscopy 11/03/2013  . Type 2 diabetes mellitus, uncontrolled ([place]) 10/07/2013  . Hypertension 10/07/2013  . Hypothyroidism 10/07/2013   Outpatient Encounter Prescriptions as of 05/08/2016  Medication Sig  . gabapentin (NEURONTIN) 300 MG capsule Take 1 capsule (300 mg total) by mouth 2 (two) times daily.  Marland Kitchen glimepiride (AMARYL) 4 MG tablet Take 1 tablet (4 mg total) by mouth daily before breakfast.  . levothyroxine (SYNTHROID, LEVOTHROID) 125 MCG tablet Take 1 tablet (125 mcg total) by mouth daily.  Marland Kitchen lisinopril (PRINIVIL,ZESTRIL) 10 MG tablet Take 1 tablet (10 mg total) by mouth daily.  . metFORMIN (GLUCOPHAGE) 1000 MG tablet TAKE ONE TABLET BY MOUTH TWICE DAILY WITH  A  MEAL  . mupirocin ointment (BACTROBAN) 2 % Apply to toes daily   No facility-administered encounter medications on file as of 05/08/2016.       Review of Systems  Constitutional: Negative.   HENT: Positive for ear pain (left ear).   Eyes: Negative.   Respiratory: Negative.   Cardiovascular: Negative.   Gastrointestinal: Negative.   Endocrine: Negative.   Genitourinary: Negative.   Musculoskeletal: Negative.   Skin: Negative.   Allergic/Immunologic: Negative.   Neurological: Negative.   Hematological: Negative.   Psychiatric/Behavioral: Negative.         Objective:   Physical Exam  Constitutional: He appears well-developed and well-nourished.  HENT:  Left Ear: External ear normal.  Left ear: Tympanic membrane is normal with normal light reflex. There appears to be some old drainage in the external canal. The ear is nontender to traction. There is no pain with opening and closing mouth with pressure over the TM joint.  Neurological:  Patient has no tremor at rest. Analysis of his handwriting shows it becoming more smaller with writing his name. There is some slowing and fine motor movement with rapid finger movement. There is no tremor noted either intention or rest. He remembers 3 of 3 items at 5 minutes.    BP 138/80 (BP Location: Left Arm)   Pulse 73   Temp 97.8 F (36.6 C) (Oral)   Ht 5' 8.5" (1.74 m)   Wt 195 lb (88.5 kg)   BMI 29.22 kg/m        Assessment & Plan:  1. Otitis, externa, infective, left We'll place him on Cortisporin otic 4 times a day for 5 days.  2. Tremor Grouping of symptoms certainly raises a concern of early Parkinson's. I do believe he has some cogwheeling and rigidity testing in his arms his signature is smaller and there is fatigability and fine motor movements of the fingers. We will follow this for the time being. I've asked him to come back in a month for his regular diabetes check at which time we'll do some of the same testing  Wardell Honour MD

## 2016-06-13 ENCOUNTER — Ambulatory Visit (INDEPENDENT_AMBULATORY_CARE_PROVIDER_SITE_OTHER): Payer: Medicare Other | Admitting: Family Medicine

## 2016-06-13 ENCOUNTER — Ambulatory Visit: Payer: Medicare Other | Admitting: Family Medicine

## 2016-06-13 ENCOUNTER — Encounter: Payer: Self-pay | Admitting: Family Medicine

## 2016-06-13 VITALS — BP 133/80 | HR 70 | Temp 97.6°F | Ht 68.5 in | Wt 190.0 lb

## 2016-06-13 DIAGNOSIS — I10 Essential (primary) hypertension: Secondary | ICD-10-CM | POA: Diagnosis not present

## 2016-06-13 DIAGNOSIS — Z23 Encounter for immunization: Secondary | ICD-10-CM

## 2016-06-13 DIAGNOSIS — E1165 Type 2 diabetes mellitus with hyperglycemia: Secondary | ICD-10-CM

## 2016-06-13 DIAGNOSIS — IMO0001 Reserved for inherently not codable concepts without codable children: Secondary | ICD-10-CM

## 2016-06-13 LAB — BAYER DCA HB A1C WAIVED: HB A1C: 8 % — AB (ref <7.0)

## 2016-06-13 MED ORDER — GLIMEPIRIDE 4 MG PO TABS: 4.0000 mg | ORAL_TABLET | Freq: Every day | ORAL | 1 refills | Status: DC

## 2016-06-13 NOTE — Progress Notes (Signed)
   Subjective:    Patient ID: Patrick [name], male    DOB: Aug 14, 1946, 70 y.o.   MRN: [id number]  HPI 70 year old gentleman here to follow-up diabetes. He tells me sugars of been around 150 fasting at home. His last A1c however would not necessarily support that at 10.7. Last time we had some concerns about Parkinson's. He tells me is signature has improved and he has not noted any change in tremor. He denies dysphagia or constipation.  Patient Active Problem List   Diagnosis Date Noted  . Thrombocytopenia ([place]) 12/26/2013  . Abnormal transaminases 11/03/2013  . Encounter for screening colonoscopy 11/03/2013  . Type 2 diabetes mellitus, uncontrolled ([place]) 10/07/2013  . Hypertension 10/07/2013  . Hypothyroidism 10/07/2013   Outpatient Encounter Prescriptions as of 06/13/2016  Medication Sig  . gabapentin (NEURONTIN) 300 MG capsule Take 1 capsule (300 mg total) by mouth 2 (two) times daily.  Marland Kitchen glimepiride (AMARYL) 4 MG tablet Take 1 tablet (4 mg total) by mouth daily before breakfast.  . levothyroxine (SYNTHROID, LEVOTHROID) 125 MCG tablet Take 1 tablet (125 mcg total) by mouth daily.  Marland Kitchen lisinopril (PRINIVIL,ZESTRIL) 10 MG tablet Take 1 tablet (10 mg total) by mouth daily.  . metFORMIN (GLUCOPHAGE) 1000 MG tablet TAKE ONE TABLET BY MOUTH TWICE DAILY WITH  A  MEAL  . mupirocin ointment (BACTROBAN) 2 % Apply to toes daily  . neomycin-polymyxin-hydrocortisone (CORTISPORIN) otic solution Place 3 drops into the left ear 4 (four) times daily.   No facility-administered encounter medications on file as of 06/13/2016.       Review of Systems  Constitutional: Negative.   HENT: Negative.   Respiratory: Negative.   Neurological: Negative.   Hematological: Negative.   Psychiatric/Behavioral: Negative.        Objective:   Physical Exam  Constitutional: He is oriented to person, place, and time. He appears well-developed and well-nourished.  Cardiovascular: Normal rate and regular rhythm.    Pulmonary/Chest: Effort normal and breath sounds normal.  Musculoskeletal: He exhibits deformity.  Neurological: He is alert and oriented to person, place, and time.  Psychiatric: He has a normal mood and affect. His behavior is normal.   BP 133/80   Pulse 70   Temp 97.6 F (36.4 C) (Oral)   Ht 5' 8.5" (1.74 m)   Wt 190 lb (86.2 kg)   BMI 28.47 kg/m         Assessment & Plan:  1. Uncontrolled type 2 diabetes mellitus without complication, without long-term current use of insulin ([place]) By history patient has been more compliant with diet and medicines. We will do A1c to confirm - Bayer DCA Hb A1c Waived  2. Essential hypertension Blood pressure is in good control 133/80 There is no change in tremor. Fine alternating rapid movements of fingers and cogwheeling is basically not present

## 2016-09-16 ENCOUNTER — Other Ambulatory Visit: Payer: Self-pay | Admitting: Family Medicine

## 2016-09-19 ENCOUNTER — Ambulatory Visit: Payer: Medicare Other | Admitting: Family Medicine

## 2016-09-23 NOTE — Progress Notes (Signed)
Subjective:    Patient ID: Patrick [name], male    DOB: 09/18/1945, 71 y.o.   MRN: [id number]  HPI 71 year old gentleman here to follow-up diabetes and high blood pressure. He also has hypothyroid condition but has not been taking thyroid supplement. He stopped the gabapentin on his own but denies any symptoms consistent with neuropathy. He has gained about 6-1/2 pounds over the last 3 months but he is not taking thyroid medicine and he admits to not watching his diet very closely over the holidays.  Patient Active Problem List   Diagnosis Date Noted  . Thrombocytopenia ([place]) 12/26/2013  . Abnormal transaminases 11/03/2013  . Encounter for screening colonoscopy 11/03/2013  . Type 2 diabetes mellitus, uncontrolled ([place]) 10/07/2013  . Hypertension 10/07/2013  . Hypothyroidism 10/07/2013   Outpatient Encounter Prescriptions as of 09/24/2016  Medication Sig  . glimepiride (AMARYL) 4 MG tablet Take 1 tablet (4 mg total) by mouth daily before breakfast.  . lisinopril (PRINIVIL,ZESTRIL) 10 MG tablet Take 1 tablet (10 mg total) by mouth daily.  . metFORMIN (GLUCOPHAGE) 1000 MG tablet Take 1 tablet (1,000 mg total) by mouth 2 (two) times daily with a meal.  . [DISCONTINUED] glimepiride (AMARYL) 4 MG tablet Take 1 tablet (4 mg total) by mouth daily before breakfast.  . [DISCONTINUED] lisinopril (PRINIVIL,ZESTRIL) 10 MG tablet Take 1 tablet (10 mg total) by mouth daily.  . [DISCONTINUED] metFORMIN (GLUCOPHAGE) 1000 MG tablet TAKE ONE TABLET BY MOUTH TWICE DAILY WITH MEALS  . mupirocin ointment (BACTROBAN) 2 % Apply to toes daily (Patient not taking: Reported on 09/24/2016)  . neomycin-polymyxin-hydrocortisone (CORTISPORIN) otic solution Place 3 drops into the left ear 4 (four) times daily. (Patient not taking: Reported on 09/24/2016)  . [DISCONTINUED] gabapentin (NEURONTIN) 300 MG capsule Take 1 capsule (300 mg total) by mouth 2 (two) times daily.  . [DISCONTINUED] levothyroxine (SYNTHROID,  LEVOTHROID) 125 MCG tablet Take 1 tablet (125 mcg total) by mouth daily.   No facility-administered encounter medications on file as of 09/24/2016.        Review of Systems  Constitutional: Negative.   HENT: Negative.   Eyes: Negative.   Respiratory: Negative.  Negative for shortness of breath.   Cardiovascular: Negative.  Negative for chest pain and leg swelling.  Gastrointestinal: Negative.   Genitourinary: Negative.   Musculoskeletal: Negative.   Skin: Negative.   Neurological: Negative.   Psychiatric/Behavioral: Negative.   All other systems reviewed and are negative.      Objective:   Physical Exam  Constitutional: He is oriented to person, place, and time. He appears well-developed and well-nourished.  Cardiovascular: Normal rate, regular rhythm and normal heart sounds.   Pulmonary/Chest: Effort normal and breath sounds normal.  Neurological: He is alert and oriented to person, place, and time.  Psychiatric: He has a normal mood and affect. His behavior is normal.   BP (!) 153/80   Pulse 70   Temp 97.1 F (36.2 C) (Oral)   Ht 5' 8.5" (1.74 m)   Wt 196 lb 9.6 oz (89.2 kg)   BMI 29.46 kg/m         Assessment & Plan:    2. Uncontrolled type 2 diabetes mellitus without complication, without long-term current use of insulin ([facility]) Last A1c was 8.0 which was an improvement from previous readings. We talked about his needing to lose some weight and follow diet more closely. - Bayer DCA Hb A1c Waived  3. Essential hypertension, benign Refill lisinopril - lisinopril (PRINIVIL,ZESTRIL) 10 MG tablet; Take  1 tablet (10 mg total) by mouth daily.  Dispense: 90 tablet;    4. Other specified hypothyroidism Check TSH and get him back on thyroid supplement  Wardell Honour MD

## 2016-09-24 ENCOUNTER — Encounter: Payer: Self-pay | Admitting: Family Medicine

## 2016-09-24 ENCOUNTER — Ambulatory Visit (INDEPENDENT_AMBULATORY_CARE_PROVIDER_SITE_OTHER): Payer: Medicare Other | Admitting: Family Medicine

## 2016-09-24 VITALS — BP 153/80 | HR 70 | Temp 97.1°F | Ht 68.5 in | Wt 196.6 lb

## 2016-09-24 DIAGNOSIS — I10 Essential (primary) hypertension: Secondary | ICD-10-CM

## 2016-09-24 DIAGNOSIS — E1165 Type 2 diabetes mellitus with hyperglycemia: Secondary | ICD-10-CM | POA: Diagnosis not present

## 2016-09-24 DIAGNOSIS — E038 Other specified hypothyroidism: Secondary | ICD-10-CM | POA: Diagnosis not present

## 2016-09-24 DIAGNOSIS — E039 Hypothyroidism, unspecified: Secondary | ICD-10-CM

## 2016-09-24 DIAGNOSIS — IMO0001 Reserved for inherently not codable concepts without codable children: Secondary | ICD-10-CM

## 2016-09-24 LAB — BAYER DCA HB A1C WAIVED: HB A1C (BAYER DCA - WAIVED): 8.6 % — ABNORMAL HIGH (ref <7.0)

## 2016-09-24 MED ORDER — LISINOPRIL 10 MG PO TABS: 10.0000 mg | ORAL_TABLET | Freq: Every day | ORAL | 1 refills | Status: DC

## 2016-09-24 MED ORDER — METFORMIN HCL 1000 MG PO TABS: 1000.0000 mg | ORAL_TABLET | Freq: Two times a day (BID) | ORAL | 1 refills | Status: DC

## 2016-09-24 MED ORDER — GLIMEPIRIDE 4 MG PO TABS: 4.0000 mg | ORAL_TABLET | Freq: Every day | ORAL | 1 refills | Status: DC

## 2016-09-25 LAB — TSH: TSH: 22.47 u[IU]/mL — ABNORMAL HIGH (ref 0.450–4.500)

## 2016-09-25 MED ORDER — LEVOTHYROXINE SODIUM 125 MCG PO TABS: 125.0000 ug | ORAL_TABLET | Freq: Every day | ORAL | 1 refills | Status: DC

## 2016-09-25 NOTE — Addendum Note (Signed)
Addended by: Thana Ates on: 09/25/2016 11:37 AM   Modules accepted: Orders

## 2016-09-27 ENCOUNTER — Telehealth: Payer: Self-pay | Admitting: Pharmacist

## 2016-09-27 NOTE — Telephone Encounter (Signed)
Patient called to follow up increase in A1c.   His A1c at appt 09/24/2016 was 8.6% which had increased from 8.0% 06/13/2016.  Patient states that he has been without one of his diabetes medications for awhile when he can in for recent visit.  This has been corrected.  Reviewed BG goals and instructed to check at least daily.  Patient is to call office for appt if BG is consistently over 150 in am or over 200 at any other time of day.  He is also to call office if he has difficulty getting meds or refills.

## 2016-12-23 ENCOUNTER — Encounter: Payer: Self-pay | Admitting: Family Medicine

## 2016-12-23 ENCOUNTER — Ambulatory Visit (INDEPENDENT_AMBULATORY_CARE_PROVIDER_SITE_OTHER): Payer: Medicare Other | Admitting: Family Medicine

## 2016-12-23 VITALS — BP 136/86 | HR 76 | Temp 97.4°F | Ht 68.5 in | Wt 199.8 lb

## 2016-12-23 DIAGNOSIS — E038 Other specified hypothyroidism: Secondary | ICD-10-CM | POA: Diagnosis not present

## 2016-12-23 DIAGNOSIS — D696 Thrombocytopenia, unspecified: Secondary | ICD-10-CM | POA: Diagnosis not present

## 2016-12-23 DIAGNOSIS — E1165 Type 2 diabetes mellitus with hyperglycemia: Secondary | ICD-10-CM | POA: Diagnosis not present

## 2016-12-23 DIAGNOSIS — IMO0001 Reserved for inherently not codable concepts without codable children: Secondary | ICD-10-CM

## 2016-12-23 LAB — BAYER DCA HB A1C WAIVED: HB A1C (BAYER DCA - WAIVED): 8.1 % — ABNORMAL HIGH (ref <7.0)

## 2016-12-23 MED ORDER — SITAGLIPTIN PHOS-METFORMIN HCL 50-1000 MG PO TABS: 1.0000 | ORAL_TABLET | Freq: Two times a day (BID) | ORAL | 5 refills | Status: DC

## 2016-12-23 NOTE — Progress Notes (Signed)
   HPI  Patient presents today here to follow-up for diabetes and other chronic medical conditions.  Hypothyroidism Good medication compliance  Diabetes Good medication compliance, moderate diet effort. Patient has recently retired, approximately 8 months ago, he had a diabetic foot wound in the left forefoot at the time which has completely resolved. Bunions bilaterally.  PMH: Smoking status noted ROS: Per HPI  Objective: BP 136/86   Pulse 76   Temp 97.4 F (36.3 C) (Oral)   Ht 5' 8.5" (1.74 m)   Wt 199 lb 12.8 oz (90.6 kg)   BMI 29.94 kg/m  Gen: NAD, alert, cooperative with exam HEENT: NCAT right TM obscured by cerumen, left TM within normal limits CV: RRR, good S1/S2, no murmur Resp: CTABL, no wheezes, non-labored Ext: No edema, warm Neuro: Alert and oriented, No gross deficits  Diabetic Foot Exam - Simple   Simple Foot Form Diabetic Foot exam was performed with the following findings:  Yes 12/23/2016 11:09 AM  Visual Inspection See comments:  Yes Sensation Testing Intact to touch and monofilament testing bilaterally:  Yes Pulse Check Posterior Tibialis and Dorsalis pulse intact bilaterally:  Yes Comments Left greater than right but severe bilateral bunions, site of previous diabetic wound well healed.      Assessment and plan:  # Type 2 diabetes Uncontrolled, A1c improved from 8.6-8.1. Discussed air. Lifestyle changes Continue Amaryl, adding Janumet instead of metformin. Follow-up 3 months.   # Hypothyroidism TSH severely elevated, Synthroid was adjusted last visit, repeat TSH  # Thrombocytopenia Mild, repeat labs     Orders Placed This Encounter  Procedures  . Microalbumin / creatinine urine ratio  . Bayer DCA Hb A1c Waived  . TSH  . CMP14+EGFR  . CBC with Differential/Platelet  . LDL cholesterol, direct    Meds ordered this encounter  Medications  . sitaGLIPtin-metformin (JANUMET) 50-1000 MG tablet    Sig: Take 1 tablet by mouth 2  (two) times daily with a meal.    Dispense:  60 tablet    Refill:  [place], MD [facility] 12/23/2016, 11:10 AM

## 2016-12-23 NOTE — Patient Instructions (Signed)
Great to meet you!  Come back in 3 months unless you need [place] sooner.   I have sent in [place], this will replace metformin

## 2016-12-24 ENCOUNTER — Telehealth: Payer: Self-pay | Admitting: Family Medicine

## 2016-12-24 LAB — CMP14+EGFR
ALBUMIN: 4.2 g/dL (ref 3.6–4.8)
ALK PHOS: 49 IU/L (ref 39–117)
ALT: 37 IU/L (ref 0–44)
AST: 28 IU/L (ref 0–40)
Albumin/Globulin Ratio: 1.5 (ref 1.2–2.2)
BUN / CREAT RATIO: 17 (ref 10–24)
BUN: 18 mg/dL (ref 8–27)
Bilirubin Total: 0.4 mg/dL (ref 0.0–1.2)
CALCIUM: 9.8 mg/dL (ref 8.6–10.2)
CO2: 23 mmol/L (ref 18–29)
CREATININE: 1.08 mg/dL (ref 0.76–1.27)
Chloride: 100 mmol/L (ref 96–106)
GFR calc Af Amer: 81 mL/min/{1.73_m2} (ref >59)
GFR, EST NON AFRICAN AMERICAN: 70 mL/min/{1.73_m2} (ref >59)
GLOBULIN, TOTAL: 2.8 g/dL (ref 1.5–4.5)
Glucose: 164 mg/dL — ABNORMAL HIGH (ref 65–99)
Potassium: 4.7 mmol/L (ref 3.5–5.2)
Sodium: 138 mmol/L (ref 134–144)
TOTAL PROTEIN: 7 g/dL (ref 6.0–8.5)

## 2016-12-24 LAB — CBC WITH DIFFERENTIAL/PLATELET
BASOS: 1 %
Basophils Absolute: 0.1 10*3/uL (ref 0.0–0.2)
EOS (ABSOLUTE): 0.2 10*3/uL (ref 0.0–0.4)
EOS: 3 %
HEMATOCRIT: 41.3 % (ref 37.5–51.0)
HEMOGLOBIN: 13.4 g/dL (ref 13.0–17.7)
IMMATURE GRANULOCYTES: 0 %
Immature Grans (Abs): 0 10*3/uL (ref 0.0–0.1)
LYMPHS ABS: 1.9 10*3/uL (ref 0.7–3.1)
Lymphs: 29 %
MCH: 30.2 pg (ref 26.6–33.0)
MCHC: 32.4 g/dL (ref 31.5–35.7)
MCV: 93 fL (ref 79–97)
MONOCYTES: 9 %
Monocytes Absolute: 0.6 10*3/uL (ref 0.1–0.9)
Neutrophils Absolute: 3.9 10*3/uL (ref 1.4–7.0)
Neutrophils: 58 %
Platelets: 125 10*3/uL — ABNORMAL LOW (ref 150–379)
RBC: 4.43 x10E6/uL (ref 4.14–5.80)
RDW: 14 % (ref 12.3–15.4)
WBC: 6.7 10*3/uL (ref 3.4–10.8)

## 2016-12-24 LAB — LDL CHOLESTEROL, DIRECT: LDL Direct: 67 mg/dL (ref 0–99)

## 2016-12-24 LAB — TSH: TSH: 1.15 u[IU]/mL (ref 0.450–4.500)

## 2016-12-24 LAB — MICROALBUMIN / CREATININE URINE RATIO
Creatinine, Urine: 181.7 mg/dL
MICROALBUM., U, RANDOM: 44 ug/mL
Microalb/Creat Ratio: 24.2 mg/g creat (ref 0.0–30.0)

## 2016-12-24 MED ORDER — SITAGLIPTIN PHOSPHATE 100 MG PO TABS: 100.0000 mg | ORAL_TABLET | Freq: Every day | ORAL | 3 refills | Status: DC

## 2016-12-24 MED ORDER — METFORMIN HCL 1000 MG PO TABS: 1000.0000 mg | ORAL_TABLET | Freq: Two times a day (BID) | ORAL | Status: DC

## 2016-12-24 NOTE — Telephone Encounter (Signed)
Change to [place] + metformin seperately.   Laroy Apple, MD [facility] 12/24/2016, 12:32 PM

## 2016-12-24 NOTE — Telephone Encounter (Signed)
-----   Message from Karle Plumber, [place] sent at 12/24/2016  9:55 AM EDT ----- Patient aware of lab results. States the janumet that was prescribed yesterday was 500 so he needs another rx sent to the pharmacy.

## 2016-12-24 NOTE — Telephone Encounter (Signed)
Patient aware of medication change. 

## 2017-03-25 ENCOUNTER — Ambulatory Visit (INDEPENDENT_AMBULATORY_CARE_PROVIDER_SITE_OTHER): Payer: Medicare Other | Admitting: Family Medicine

## 2017-03-25 ENCOUNTER — Encounter: Payer: Self-pay | Admitting: Family Medicine

## 2017-03-25 VITALS — BP 138/82 | HR 86 | Temp 98.3°F | Ht 68.5 in | Wt 200.8 lb

## 2017-03-25 DIAGNOSIS — L84 Corns and callosities: Secondary | ICD-10-CM | POA: Diagnosis not present

## 2017-03-25 DIAGNOSIS — E1165 Type 2 diabetes mellitus with hyperglycemia: Secondary | ICD-10-CM

## 2017-03-25 DIAGNOSIS — I10 Essential (primary) hypertension: Secondary | ICD-10-CM | POA: Diagnosis not present

## 2017-03-25 DIAGNOSIS — IMO0001 Reserved for inherently not codable concepts without codable children: Secondary | ICD-10-CM

## 2017-03-25 MED ORDER — METFORMIN HCL 1000 MG PO TABS: 1000.0000 mg | ORAL_TABLET | Freq: Two times a day (BID) | ORAL | 3 refills | Status: DC

## 2017-03-25 MED ORDER — LEVOTHYROXINE SODIUM 125 MCG PO TABS: 125.0000 ug | ORAL_TABLET | Freq: Every day | ORAL | 3 refills | Status: DC

## 2017-03-25 MED ORDER — LISINOPRIL 10 MG PO TABS: 10.0000 mg | ORAL_TABLET | Freq: Every day | ORAL | 3 refills | Status: DC

## 2017-03-25 MED ORDER — SITAGLIPTIN PHOSPHATE 100 MG PO TABS: 100.0000 mg | ORAL_TABLET | Freq: Every day | ORAL | 3 refills | Status: DC

## 2017-03-25 MED ORDER — GLIMEPIRIDE 4 MG PO TABS: 4.0000 mg | ORAL_TABLET | Freq: Every day | ORAL | 3 refills | Status: DC

## 2017-03-25 NOTE — Progress Notes (Signed)
   HPI  Patient presents today here to follow-up for chronic medical conditions  Diabetes Watching diet as well as he can Fasting blood sugar ranging 1:30 to 150, most average 130s. Good medication compliance, no hypoglycemia. Occupation active but no formal exercise routine. 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  Hypertension Good medication compliance with no chest pain, leg swelling, headaches, dyspnea.  Foot callus Asymptomatic  PMH: Smoking status noted ROS: Per HPI  Objective: BP 138/82   Pulse 86   Temp 98.3 F (36.8 C) (Oral)   Ht 5' 8.5" (1.74 m)   Wt 200 lb 12.8 oz (91.1 kg)   BMI 30.09 kg/m  Gen: NAD, alert, cooperative with exam HEENT: NCAT, EOMI, PERRL CV: RRR, good S1/S2, no murmur Resp: CTABL, no wheezes, non-labored Abd: SNTND, BS present, no guarding or organomegaly Ext: No edema, warm Neuro: Alert and oriented, No gross deficits  Diabetic Foot Exam - Simple   Simple Foot Form Diabetic Foot exam was performed with the following findings:  Yes 03/25/2017  9:20 AM  Visual Inspection See comments:  Yes Sensation Testing Intact to touch and monofilament testing bilaterally:  Yes Pulse Check Posterior Tibialis and Dorsalis pulse intact bilaterally:  Yes Comments Sensation intact monofilament throughout Some overlapping and misshapen toes, left foot with forefoot medial callus.      Assessment and plan:  # T2DM Improved, still slightly uncontrolled Continue metformin, Amaryl, and Januvia. Consider adding additional medication, however patient feels that he has room to budge on his diet.  # Hypertension Well-controlled on lisinopril No changes, BMP  # Foot callus Asymptomatic, noted above Patient also with some unusual angulation of toes, he would be a good candidate for diabetic shoes if he develops any discomfort or ulcers.    Orders Placed This Encounter  Procedures  . Bayer DCA Hb  A1c Waived  . BMP8+EGFR    Meds ordered this encounter  Medications  . lisinopril (PRINIVIL,ZESTRIL) 10 MG tablet    Sig: Take 1 tablet (10 mg total) by mouth daily.    Dispense:  90 tablet    Refill:  3  . glimepiride (AMARYL) 4 MG tablet    Sig: Take 1 tablet (4 mg total) by mouth daily before breakfast.    Dispense:  90 tablet    Refill:  3  . levothyroxine (SYNTHROID, LEVOTHROID) 125 MCG tablet    Sig: Take 1 tablet (125 mcg total) by mouth daily.    Dispense:  90 tablet    Refill:  3  . metFORMIN (GLUCOPHAGE) 1000 MG tablet    Sig: Take 1 tablet (1,000 mg total) by mouth 2 (two) times daily with a meal.    Dispense:  180 tablet    Refill:  3  . sitaGLIPtin (JANUVIA) 100 MG tablet    Sig: Take 1 tablet (100 mg total) by mouth daily.    Dispense:  90 tablet    Refill:  [place], MD [facility] 03/25/2017, 9:28 AM

## 2017-03-25 NOTE — Patient Instructions (Signed)
Great to see you!  Come back in 3 months unless you need [place] sooner.   You are so close to the goal of an A1C less than 8, be sure to keep watching your diet

## 2017-03-26 LAB — BMP8+EGFR
BUN / CREAT RATIO: 17 (ref 10–24)
BUN: 20 mg/dL (ref 8–27)
CO2: 21 mmol/L (ref 20–29)
Calcium: 9.5 mg/dL (ref 8.6–10.2)
Chloride: 100 mmol/L (ref 96–106)
Creatinine, Ser: 1.19 mg/dL (ref 0.76–1.27)
GFR, EST AFRICAN AMERICAN: 72 mL/min/{1.73_m2} (ref >59)
GFR, EST NON AFRICAN AMERICAN: 62 mL/min/{1.73_m2} (ref >59)
Glucose: 221 mg/dL — ABNORMAL HIGH (ref 65–99)
POTASSIUM: 5 mmol/L (ref 3.5–5.2)
SODIUM: 138 mmol/L (ref 134–144)

## 2017-03-26 LAB — BAYER DCA HB A1C WAIVED: HB A1C (BAYER DCA - WAIVED): 8 % — ABNORMAL HIGH (ref <7.0)

## 2017-06-11 ENCOUNTER — Ambulatory Visit (INDEPENDENT_AMBULATORY_CARE_PROVIDER_SITE_OTHER): Payer: Medicare Other

## 2017-06-11 DIAGNOSIS — Z23 Encounter for immunization: Secondary | ICD-10-CM

## 2017-07-29 ENCOUNTER — Ambulatory Visit: Payer: Medicare Other | Admitting: Family Medicine

## 2017-07-30 ENCOUNTER — Encounter: Payer: Self-pay | Admitting: Family Medicine

## 2017-08-18 ENCOUNTER — Ambulatory Visit (INDEPENDENT_AMBULATORY_CARE_PROVIDER_SITE_OTHER): Payer: Medicare Other | Admitting: Family Medicine

## 2017-08-18 ENCOUNTER — Encounter: Payer: Self-pay | Admitting: Family Medicine

## 2017-08-18 VITALS — BP 147/77 | HR 90 | Temp 98.6°F | Ht 68.5 in | Wt 184.4 lb

## 2017-08-18 DIAGNOSIS — I10 Essential (primary) hypertension: Secondary | ICD-10-CM

## 2017-08-18 DIAGNOSIS — R221 Localized swelling, mass and lump, neck: Secondary | ICD-10-CM | POA: Diagnosis not present

## 2017-08-18 MED ORDER — LEVOTHYROXINE SODIUM 125 MCG PO TABS: 125.0000 ug | ORAL_TABLET | Freq: Every day | ORAL | 3 refills | Status: DC

## 2017-08-18 MED ORDER — METFORMIN HCL 1000 MG PO TABS: 1000.0000 mg | ORAL_TABLET | Freq: Two times a day (BID) | ORAL | 3 refills | Status: DC

## 2017-08-18 MED ORDER — CEFTRIAXONE SODIUM 1 G IJ SOLR
1.0000 g | Freq: Once | INTRAMUSCULAR | Status: AC
  Administered 2017-08-18: 1 g via INTRAMUSCULAR

## 2017-08-18 MED ORDER — LISINOPRIL 10 MG PO TABS: 10.0000 mg | ORAL_TABLET | Freq: Every day | ORAL | 3 refills | Status: DC

## 2017-08-18 MED ORDER — SITAGLIPTIN PHOSPHATE 100 MG PO TABS: 100.0000 mg | ORAL_TABLET | Freq: Every day | ORAL | 3 refills | Status: DC

## 2017-08-18 MED ORDER — AMOXICILLIN-POT CLAVULANATE 875-125 MG PO TABS: 1.0000 | ORAL_TABLET | Freq: Two times a day (BID) | ORAL | 0 refills | Status: DC

## 2017-08-18 MED ORDER — GLIMEPIRIDE 4 MG PO TABS: 4.0000 mg | ORAL_TABLET | Freq: Every day | ORAL | 3 refills | Status: DC

## 2017-08-18 NOTE — Progress Notes (Addendum)
   HPI  Patient presents today for neck mass.  Patient states that it started on his right side of his neck and enlarged quickly.  It has not changed much in the last few days. He has mild discomfort.  He has difficulty turning his neck to the right. He denies fever, chills, sweats.  He refuses to go to the emergency room or pursue a CT scan.,  Understandably due to cost.  PMH: Smoking status noted ROS: Per HPI  Objective: BP (!) 147/77   Pulse 90   Temp 98.6 F (37 C) (Oral)   Ht 5' 8.5" (1.74 m)   Wt 184 lb 6.4 oz (83.6 kg)   BMI 27.63 kg/m  Gen: NAD, alert, cooperative with exam HEENT: NCAT, very firm mass under the right mandible measuring 9.5 cm x 6.5 cm, overlying erythema, also with warmth CV: RRR, good S1/S2, no murmur Resp: CTABL, no wheezes, non-labored Ext: No edema, warm Neuro: Alert and oriented, No gross deficits  Assessment and plan:  #Neck mass Likely abscess, given Rocephin plus Augmentin Likely very enlarged LN or abscess, however lymphoma or other malignancy are definitely a consideration Recommended CT, he refuses due to cost.  Follow-up in about 1 week, this is limited by the holiday. Recommended extremely low threshold for seeking emergency medical care, discussed multiple red flags including fever, difficulty swallowing, headache, change in hearing   Also states that he is out of multiple medications today, I offered labs for chronic medical problems, however he states that he would like to come back in January for that. I have shown his case to Particia Nearing who he will follow-up with as well as [facility] triage nurse to ensure good continuity of care over the ho;lidays.   Chronic meds refilled.    Meds ordered this encounter  Medications  . amoxicillin-clavulanate (AUGMENTIN) 875-125 MG tablet    Sig: Take 1 tablet by mouth 2 (two) times daily.    Dispense:  20 tablet    Refill:  0  . cefTRIAXone (ROCEPHIN) injection 1 g    Laroy Apple,  MD [facility] 08/18/2017, 4:14 PM

## 2017-08-18 NOTE — Patient Instructions (Signed)
Great to see you!  Start augmentin today.   Come back next Wednesday to See Patrick [name].   Seek emergency medical care if he has any difficulty swallowing, fevers, worsening pain, trouble hearing, trouble seeing, or headaches.  If you are getting worse please seek emergency medical care or come back to our clinic sooner.

## 2017-08-21 ENCOUNTER — Ambulatory Visit (INDEPENDENT_AMBULATORY_CARE_PROVIDER_SITE_OTHER): Payer: Medicare Other | Admitting: Family Medicine

## 2017-08-21 ENCOUNTER — Ambulatory Visit (INDEPENDENT_AMBULATORY_CARE_PROVIDER_SITE_OTHER): Payer: Medicare Other | Admitting: Otolaryngology

## 2017-08-21 ENCOUNTER — Encounter: Payer: Self-pay | Admitting: Family Medicine

## 2017-08-21 VITALS — BP 138/77 | HR 88 | Temp 96.8°F | Ht 68.5 in | Wt 186.0 lb

## 2017-08-21 DIAGNOSIS — K1121 Acute sialoadenitis: Secondary | ICD-10-CM | POA: Diagnosis not present

## 2017-08-21 DIAGNOSIS — R221 Localized swelling, mass and lump, neck: Secondary | ICD-10-CM | POA: Diagnosis not present

## 2017-08-21 NOTE — Patient Instructions (Signed)
Great to see you!   

## 2017-08-21 NOTE — Progress Notes (Signed)
   HPI  Patient presents today here with right neck mass for follow-up.  Pt states that augmentin does not seem to be helping. He denies any problems swallowing or severe pain.   Januvia was too expensive, he needs a cheaper medication.  He denies fever, chills, sweats.  He is tolerating food and fluids by mouth like usual. He does not want to go to the emergency room due to cost.  PMH: Smoking status noted ROS: Per HPI  Objective: BP 138/77   Pulse 88   Temp (!) 96.8 F (36 C) (Oral)   Ht 5' 8.5" (1.74 m)   Wt 186 lb (84.4 kg)   BMI 27.87 kg/m  Gen: NAD, alert, cooperative with exam HEENT: NCAT, oropharynx moist and clear, nares clear, right neck mass measures 8 cm x 6.5 cm CV: RRR, good S1/S2, no murmur Resp: CTABL, no wheezes, non-labored Ext: No edema, warm Neuro: Alert and oriented, No gross deficits  Assessment and plan:  #Neck mass Stable to slightly improved, unclear if there is any actual improvement as measurement is difficult on this large of a mass. Most likely an abscess     Orders Placed This Encounter  Procedures  . Ambulatory referral to ENT    Referral Priority:   Urgent    Referral Type:   Consultation    Referral Reason:   Specialty Services Required    Referred to Provider:   Leta Baptist, MD    Requested Specialty:   Otolaryngology    Number of Visits Requested:   [place], MD [facility] 08/21/2017, 11:16 AM

## 2017-08-22 ENCOUNTER — Other Ambulatory Visit (INDEPENDENT_AMBULATORY_CARE_PROVIDER_SITE_OTHER): Payer: Self-pay | Admitting: Otolaryngology

## 2017-08-22 DIAGNOSIS — R221 Localized swelling, mass and lump, neck: Secondary | ICD-10-CM

## 2017-08-27 ENCOUNTER — Ambulatory Visit (INDEPENDENT_AMBULATORY_CARE_PROVIDER_SITE_OTHER): Payer: Medicare Other | Admitting: Physician Assistant

## 2017-08-27 ENCOUNTER — Encounter: Payer: Self-pay | Admitting: Physician Assistant

## 2017-08-27 DIAGNOSIS — L0211 Cutaneous abscess of neck: Secondary | ICD-10-CM | POA: Diagnosis not present

## 2017-08-27 NOTE — Patient Instructions (Signed)
In a few days you may receive a survey in the mail or online from [employer] regarding your visit with us today. Please take a moment to fill this out. Your feedback is very important to our whole office. It can help us better understand your needs as well as improve your experience and satisfaction. Thank you for taking your time to complete it. We care about you.  Rameen Gohlke, PA-C  

## 2017-08-27 NOTE — Progress Notes (Signed)
BP 133/83   Pulse 75   Temp (!) 97 F (36.1 C) (Oral)   Ht 5' 8.5" (1.74 m)   Wt 187 lb (84.8 kg)   BMI 28.02 kg/m    Subjective:    Patient ID: Patrick [name], male    DOB: 01-15-1946, 71 y.o.   MRN: [id number]  HPI: Patrick [name] is a 71 y.o. male presenting on 08/27/2017 for recheck lump on right under jaw This patient comes in for recheck on abscess treated last week by Dr. Wendi Snipes.  He was seen by ear nose and throat doctor Dr. Benjamine Mola, and started on clindamycin 300 mg 4 times daily.  He states he has been tolerating the medicine well.  His stools are slightly loose.  He states that he can tell a great difference in the abscess and it is not hurting nearly as badly.  It does have some itching at times.  He denies any fever or chills.  He is scheduled for a CT scan of the neck next week and a follow-up appointment with Dr. Benjamine Mola.  I encouraged him to keep that.  This note will be forwarded to his ear nose and throat doctor   Relevant past medical, surgical, family and social history reviewed and updated as indicated. Allergies and medications reviewed and updated.  Past Medical History:  Diagnosis Date  . Diabetes mellitus without complication ([place])   . Thyroid disease     Past Surgical History:  Procedure Laterality Date  . None      Review of Systems  Constitutional: Negative.  Negative for appetite change, fatigue and fever.  HENT: Negative.  Negative for ear discharge, ear pain, sore throat and trouble swallowing.   Eyes: Negative.  Negative for pain and visual disturbance.  Respiratory: Negative.  Negative for cough, chest tightness, shortness of breath and wheezing.   Cardiovascular: Negative.  Negative for chest pain, palpitations and leg swelling.  Gastrointestinal: Negative.  Negative for abdominal pain, diarrhea, nausea and vomiting.  Endocrine: Negative.   Genitourinary: Negative.   Musculoskeletal: Negative.   Skin: Positive for wound. Negative for color  change and rash.  Neurological: Negative.  Negative for weakness, numbness and headaches.  Psychiatric/Behavioral: Negative.     Allergies as of 08/27/2017      Reactions   Codeine       Medication List        Accurate as of 08/27/17  9:03 AM. Always use your most recent med list.          clindamycin 300 MG capsule Commonly known as:  CLEOCIN Take 300 mg by mouth 4 (four) times daily.   glimepiride 4 MG tablet Commonly known as:  AMARYL Take 1 tablet (4 mg total) by mouth daily before breakfast.   levothyroxine 125 MCG tablet Commonly known as:  SYNTHROID, LEVOTHROID Take 1 tablet (125 mcg total) by mouth daily.   lisinopril 10 MG tablet Commonly known as:  PRINIVIL,ZESTRIL Take 1 tablet (10 mg total) by mouth daily.   metFORMIN 1000 MG tablet Commonly known as:  GLUCOPHAGE Take 1 tablet (1,000 mg total) by mouth 2 (two) times daily with a meal.          Objective:    BP 133/83   Pulse 75   Temp (!) 97 F (36.1 C) (Oral)   Ht 5' 8.5" (1.74 m)   Wt 187 lb (84.8 kg)   BMI 28.02 kg/m   Allergies  Allergen Reactions  . Codeine  Physical Exam  Constitutional: He appears well-developed and well-nourished. No distress.  HENT:  Head: Normocephalic and atraumatic.  Eyes: Conjunctivae and EOM are normal. Pupils are equal, round, and reactive to light.  Neck: No thyroid mass present.    Greatly decreased size of abscess from last week. Skin is not hot, no drainage.  Cardiovascular: Normal rate, regular rhythm and normal heart sounds.  Pulmonary/Chest: Effort normal and breath sounds normal. No respiratory distress.  Skin: Skin is warm and dry.  Psychiatric: He has a normal mood and affect. His behavior is normal.  Nursing note and vitals reviewed.       Assessment & Plan:   1. Abscess of neck - clindamycin (CLEOCIN) 300 MG capsule; Take 300 mg by mouth 4 (four) times daily.    Current Outpatient Medications:  .  clindamycin (CLEOCIN) 300 MG  capsule, Take 300 mg by mouth 4 (four) times daily., Disp: , Rfl:  .  glimepiride (AMARYL) 4 MG tablet, Take 1 tablet (4 mg total) by mouth daily before breakfast., Disp: 90 tablet, Rfl: 3 .  levothyroxine (SYNTHROID, LEVOTHROID) 125 MCG tablet, Take 1 tablet (125 mcg total) by mouth daily., Disp: 90 tablet, Rfl: 3 .  lisinopril (PRINIVIL,ZESTRIL) 10 MG tablet, Take 1 tablet (10 mg total) by mouth daily., Disp: 90 tablet, Rfl: 3 .  metFORMIN (GLUCOPHAGE) 1000 MG tablet, Take 1 tablet (1,000 mg total) by mouth 2 (two) times daily with a meal., Disp: 180 tablet, Rfl: 3 Continue all other maintenance medications as listed above.  Follow up plan: Return if symptoms worsen or fail to improve.  Educational handout given for [place] PA-C [place] [address]  [place], [place] [address] [phone]   08/27/2017, 9:03 AM

## 2017-08-27 NOTE — Addendum Note (Signed)
Addended by: Terald Sleeper on: 08/27/2017 09:09 AM   Modules accepted: Level of Service

## 2017-09-03 ENCOUNTER — Ambulatory Visit (HOSPITAL_COMMUNITY)
Admission: RE | Admit: 2017-09-03 | Discharge: 2017-09-03 | Disposition: A | Discharge status: Home / Self Care | Payer: Medicare Other | Source: Ambulatory Visit | Attending: Otolaryngology | Admitting: Otolaryngology

## 2017-09-03 DIAGNOSIS — K149 Disease of tongue, unspecified: Secondary | ICD-10-CM | POA: Insufficient documentation

## 2017-09-03 DIAGNOSIS — R221 Localized swelling, mass and lump, neck: Secondary | ICD-10-CM | POA: Diagnosis not present

## 2017-09-03 LAB — POCT I-STAT CREATININE: Creatinine, Ser: 0.9 mg/dL (ref 0.61–1.24)

## 2017-09-03 MED ORDER — IOPAMIDOL (ISOVUE-300) INJECTION 61%
75.0000 mL | Freq: Once | INTRAVENOUS | Status: AC | PRN
  Administered 2017-09-03: 75 mL via INTRAVENOUS

## 2017-09-04 ENCOUNTER — Ambulatory Visit (INDEPENDENT_AMBULATORY_CARE_PROVIDER_SITE_OTHER): Payer: Medicare Other | Admitting: Otolaryngology

## 2017-09-04 DIAGNOSIS — D487 Neoplasm of uncertain behavior of other specified sites: Secondary | ICD-10-CM | POA: Diagnosis not present

## 2017-09-04 DIAGNOSIS — R590 Localized enlarged lymph nodes: Secondary | ICD-10-CM

## 2017-09-05 ENCOUNTER — Ambulatory Visit (HOSPITAL_COMMUNITY): Payer: Medicare Other

## 2017-10-16 ENCOUNTER — Ambulatory Visit (INDEPENDENT_AMBULATORY_CARE_PROVIDER_SITE_OTHER): Payer: Medicare Other | Admitting: Otolaryngology

## 2017-10-16 DIAGNOSIS — R590 Localized enlarged lymph nodes: Secondary | ICD-10-CM | POA: Diagnosis not present

## 2017-10-17 ENCOUNTER — Other Ambulatory Visit (INDEPENDENT_AMBULATORY_CARE_PROVIDER_SITE_OTHER): Payer: Self-pay | Admitting: Otolaryngology

## 2017-10-17 DIAGNOSIS — R221 Localized swelling, mass and lump, neck: Secondary | ICD-10-CM

## 2017-10-27 ENCOUNTER — Other Ambulatory Visit: Payer: Self-pay | Admitting: Radiology

## 2017-10-28 ENCOUNTER — Ambulatory Visit (HOSPITAL_COMMUNITY)
Admission: RE | Admit: 2017-10-28 | Discharge: 2017-10-28 | Disposition: A | Discharge status: Home / Self Care | Payer: Medicare Other | Source: Ambulatory Visit | Attending: Otolaryngology | Admitting: Otolaryngology

## 2017-10-28 ENCOUNTER — Encounter (HOSPITAL_COMMUNITY): Payer: Self-pay

## 2018-01-02 IMAGING — CT CT NECK W/ CM
3 of 5 series · 12 of 33 positions shown, 14 images · IV contrast (iopamidol)
Comparison: None.

CLINICAL DATA: 71 y/o M; 3 weeks of right-sided neck mass,
decreased in size.

EXAM:
CT NECK WITH CONTRAST
TECHNIQUE: Multidetector CT imaging of the neck was performed using the
standard protocol following the bolus administration of intravenous
contrast.
CONTRAST:  75mL AL4HYT-1NN IOPAMIDOL (AL4HYT-1NN) INJECTION 61%

[Series 6: coronal neck · coronal · 0.45mm/px · 3 of 118 slices shown]
[im 24/118  bone]
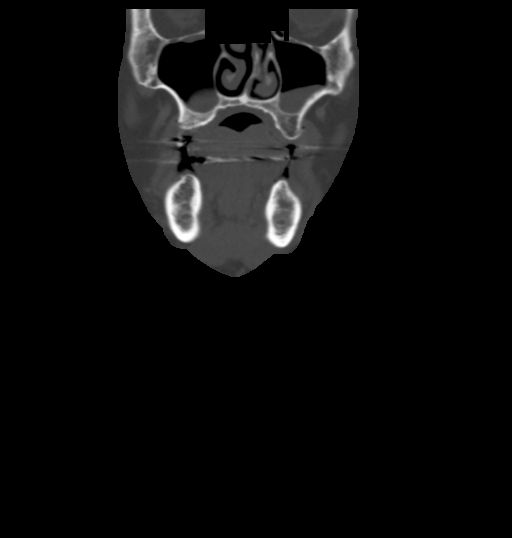
[im 47/118  bone]
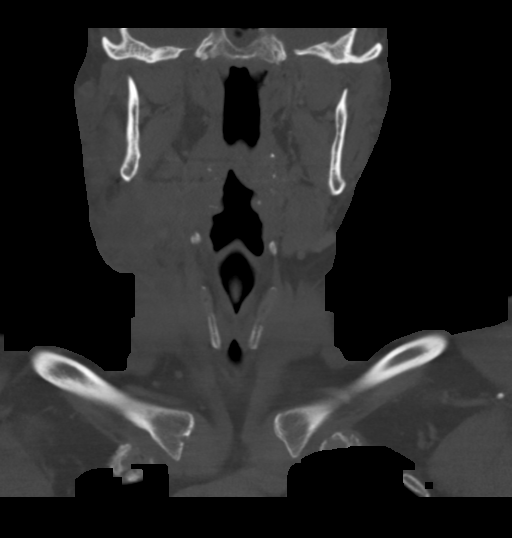
[im 71/118  bone]
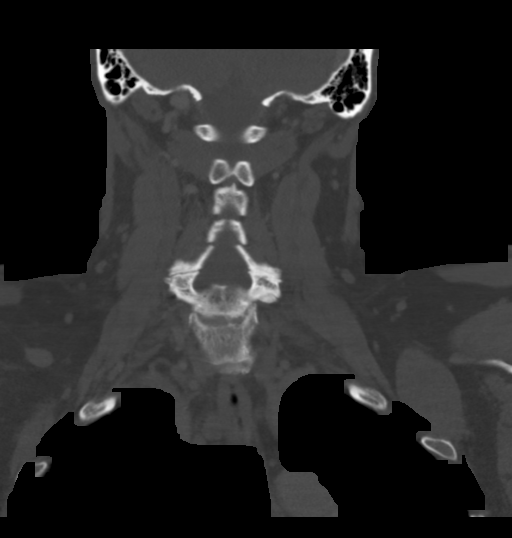

[Series 7: sagittal neck · sagittal · 0.42mm/px · 5 of 89 slices shown, 6 images]
[im 30/89  bone]
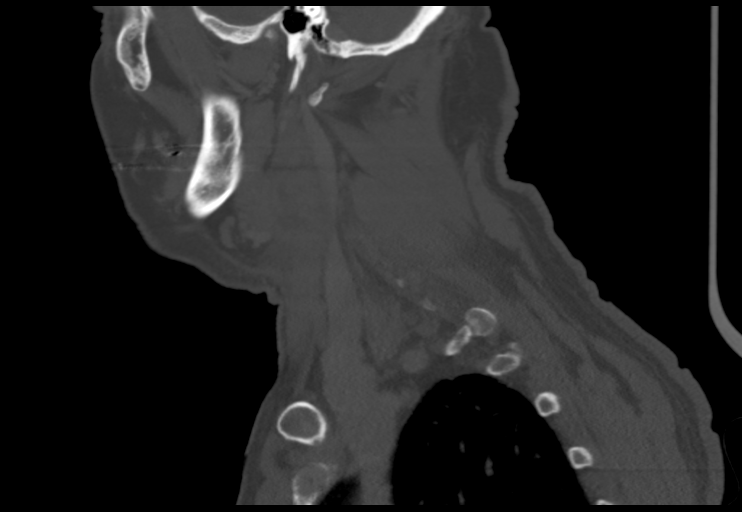
[im 37/89  bone]
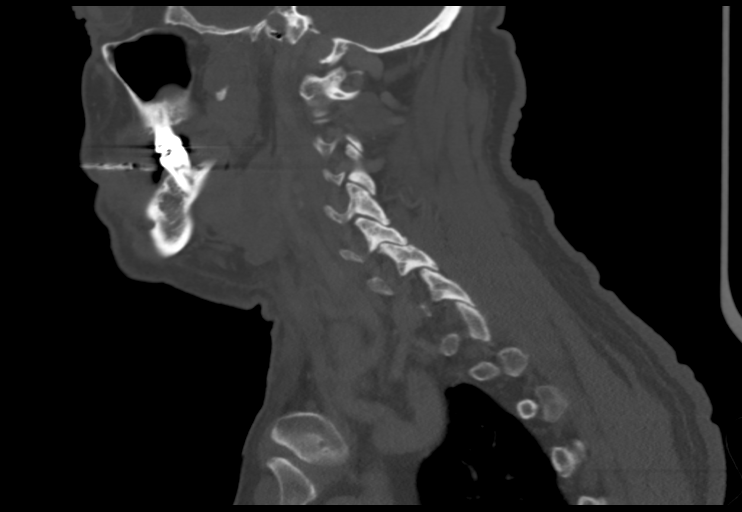
[im 45/89  soft-tissue]
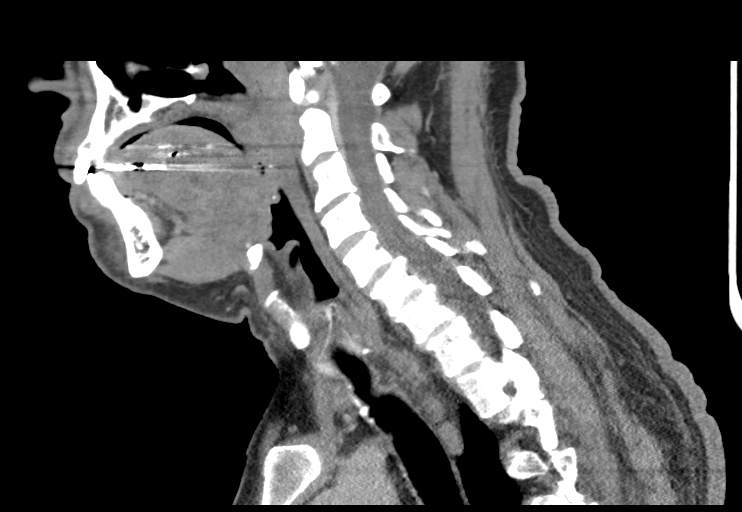
[im 45/89  bone]
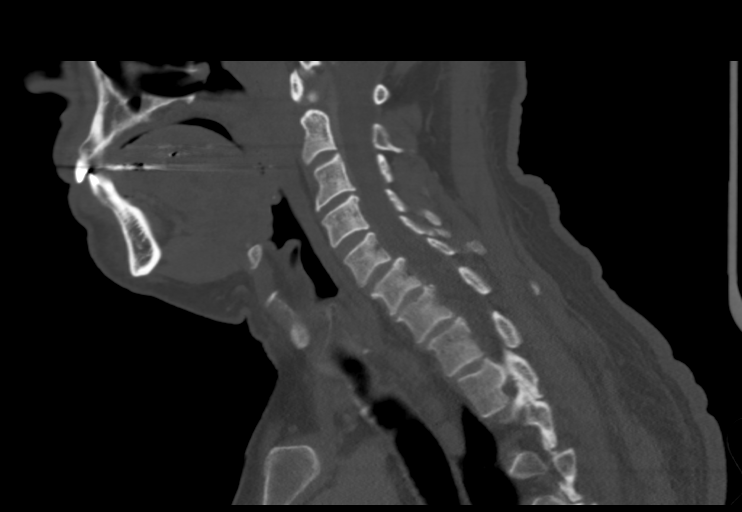
[im 52/89  bone]
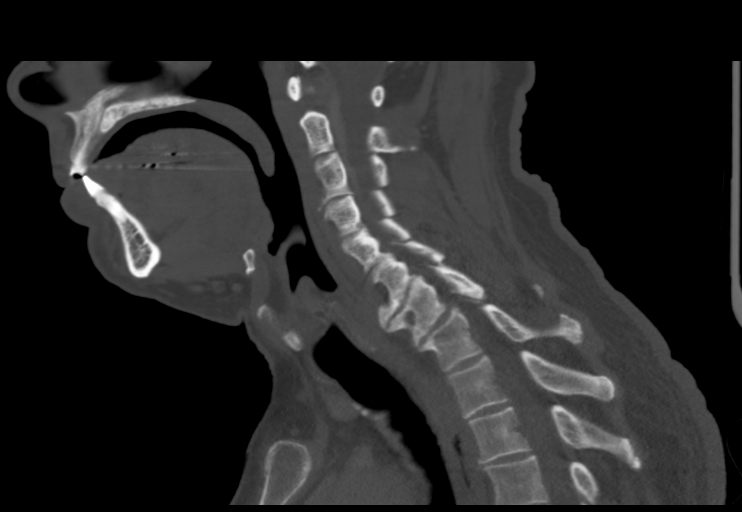
[im 59/89  bone]
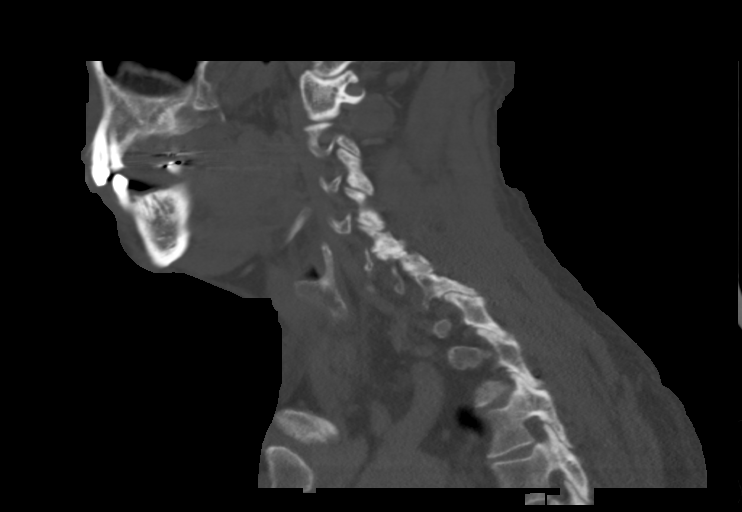

[Series 8: orthogonal ax · axial · 0.39mm/px · z∈[-72,+100]mm · 4 of 132 slices shown, 5 images]
[im 22/132  soft-tissue]
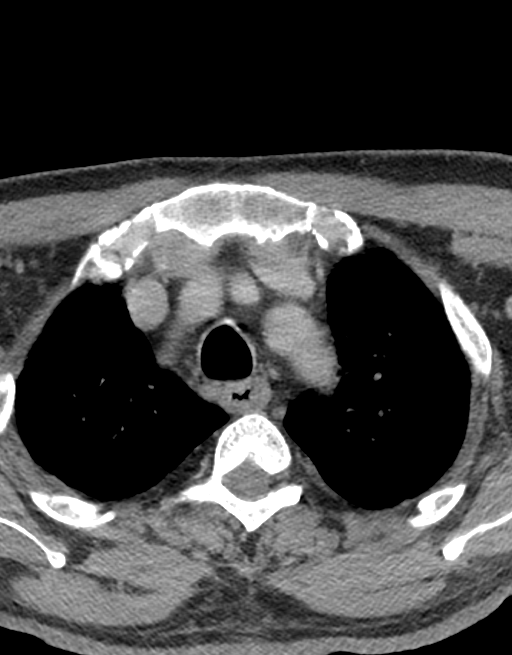
[im 22/132  bone]
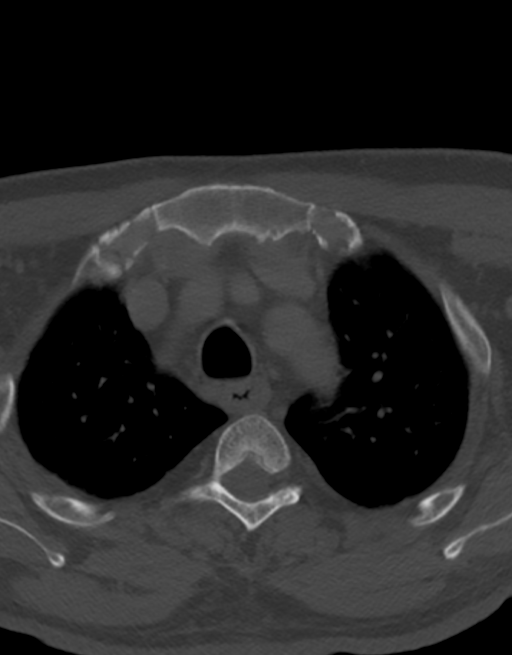
[im 44/132  bone]
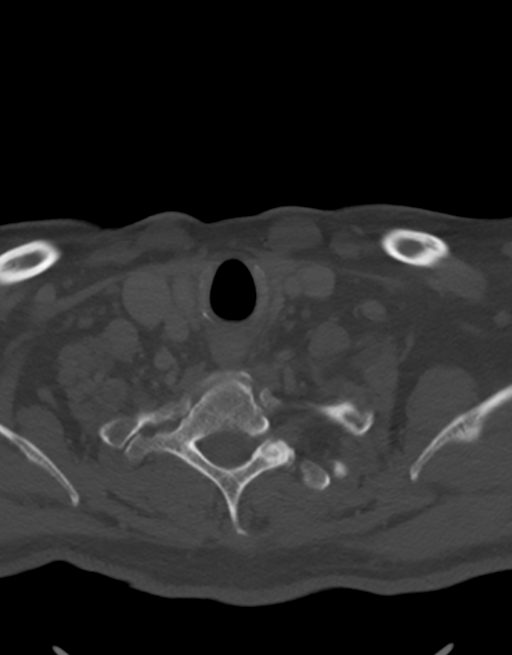
[im 88/132  bone]
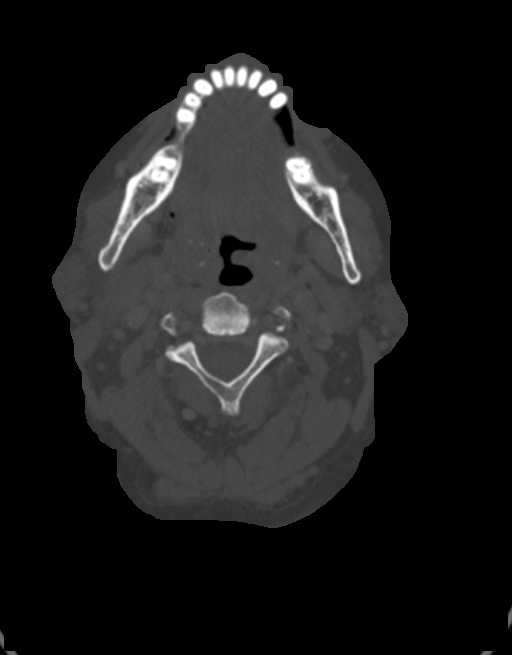
[im 110/132  bone]
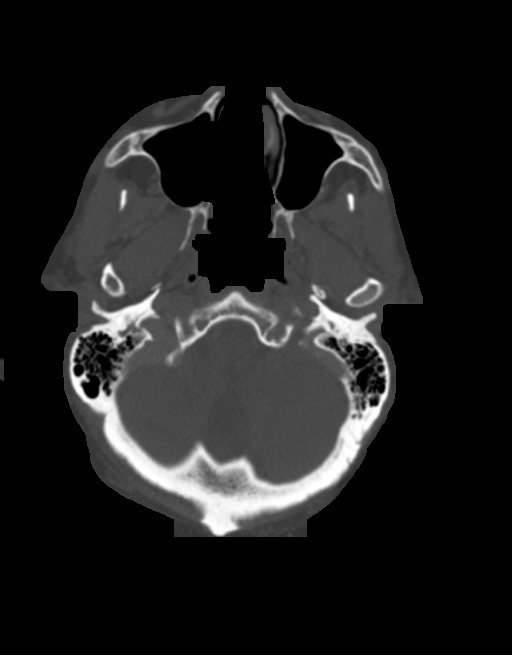

[12 of 33 positions shown; findings below may reference images not displayed]

FINDINGS: Pharynx and larynx: Mucosal thickening of right base of tongue,
glossopharyngeal sulcus, and right lateral oropharyngeal wall
(series 2, image 41).

Salivary glands: No inflammation, mass, or stone.

Thyroid: Normal.

Lymph nodes: Cystic mass with thick peripheral rim of enhancement in
the right level 2 cervical station measuring 26 x 30 x 34 mm (AP x
ML x CC series 2, image 42 and series 7, image 27). Fat stranding in
the right anterior cervical triangle surrounding the mass. The mass
has a ill-defined boundary with the sternocleidomastoid muscle. No
other potentially enlarged lymph node.

Vascular: Mild calcific atherosclerosis of aortic arch and carotid
bifurcations. Mass effect on the right internal jugular vein right
anterior cervical cystic lesion without occlusion.

Limited intracranial: Negative.

Visualized orbits: Negative.

Mastoids and visualized paranasal sinuses: Bilateral maxillary sinus
mucous retention cyst. Otherwise negative.

Skeleton: Cervical spondylosis with disc and facet degenerative
changes greatest at the C6-7 level with there is moderate loss of
disc space height. C5-6 grade 1 anterolisthesis.

Upper chest: Negative.

Other: None.
IMPRESSION: 1. Cystic mass with thick peripheral rim of enhancement in the right
upper anterior cervical chain measuring up to 34 mm with ill-defined
margins and surrounding fat stranding. Differential includes
suppurative lymphadenitis/abscess or metastatic lymph node with
extranodal extension.
2. Mucosal thickening of right base of tongue, glossopharyngeal
sulcus, and right lateral oropharyngeal wall. Direct visualization
recommended to exclude oropharyngeal squamous cell carcinoma.

By: Sashe Troupe M.D.

## 2018-07-13 ENCOUNTER — Ambulatory Visit (INDEPENDENT_AMBULATORY_CARE_PROVIDER_SITE_OTHER): Payer: Medicare Other

## 2018-07-13 DIAGNOSIS — Z23 Encounter for immunization: Secondary | ICD-10-CM | POA: Diagnosis not present

## 2018-12-09 ENCOUNTER — Other Ambulatory Visit: Payer: Self-pay | Admitting: *Deleted

## 2018-12-09 DIAGNOSIS — I10 Essential (primary) hypertension: Secondary | ICD-10-CM

## 2018-12-09 MED ORDER — LISINOPRIL 10 MG PO TABS: 10.0000 mg | ORAL_TABLET | Freq: Every day | ORAL | 0 refills | Status: DC

## 2018-12-09 MED ORDER — GLIMEPIRIDE 4 MG PO TABS: 4.0000 mg | ORAL_TABLET | Freq: Every day | ORAL | 0 refills | Status: DC

## 2018-12-09 MED ORDER — METFORMIN HCL 1000 MG PO TABS: 1000.0000 mg | ORAL_TABLET | Freq: Two times a day (BID) | ORAL | 0 refills | Status: DC

## 2019-01-04 ENCOUNTER — Encounter: Payer: Self-pay | Admitting: Family Medicine

## 2019-01-04 ENCOUNTER — Ambulatory Visit (INDEPENDENT_AMBULATORY_CARE_PROVIDER_SITE_OTHER): Payer: Medicare Other | Admitting: Family Medicine

## 2019-01-04 ENCOUNTER — Other Ambulatory Visit: Payer: Self-pay

## 2019-01-04 DIAGNOSIS — E038 Other specified hypothyroidism: Secondary | ICD-10-CM | POA: Diagnosis not present

## 2019-01-04 DIAGNOSIS — E1169 Type 2 diabetes mellitus with other specified complication: Secondary | ICD-10-CM | POA: Diagnosis not present

## 2019-01-04 DIAGNOSIS — I10 Essential (primary) hypertension: Secondary | ICD-10-CM | POA: Diagnosis not present

## 2019-01-04 DIAGNOSIS — E1159 Type 2 diabetes mellitus with other circulatory complications: Secondary | ICD-10-CM

## 2019-01-04 MED ORDER — LISINOPRIL 10 MG PO TABS: 10.0000 mg | ORAL_TABLET | Freq: Every day | ORAL | 3 refills | Status: DC

## 2019-01-04 MED ORDER — GLIMEPIRIDE 4 MG PO TABS: 4.0000 mg | ORAL_TABLET | Freq: Every day | ORAL | 3 refills | Status: DC

## 2019-01-04 MED ORDER — LEVOTHYROXINE SODIUM 125 MCG PO TABS: 125.0000 ug | ORAL_TABLET | Freq: Every day | ORAL | 3 refills | Status: DC

## 2019-01-04 MED ORDER — METFORMIN HCL 1000 MG PO TABS: 1000.0000 mg | ORAL_TABLET | Freq: Two times a day (BID) | ORAL | 3 refills | Status: DC

## 2019-01-04 NOTE — Progress Notes (Signed)
Attempted to contact patient - NVM 

## 2019-01-04 NOTE — Progress Notes (Signed)
Virtual Visit via telephone Note  I connected with Patrick [name] on 01/04/19 at 1042 by telephone and verified that I am speaking with the correct person using two identifiers. Patrick [name] is currently located at car and no other people are currently with her during visit. The provider, Fransisca Kaufmann Dettinger, MD is located in their office at time of visit.  Call ended at 1055  I discussed the limitations, risks, security and privacy concerns of performing an evaluation and management service by telephone and the availability of in person appointments. I also discussed with the patient that there may be a patient responsible charge related to this service. The patient expressed understanding and agreed to proceed.   History and Present Illness: Type 2 diabetes mellitus Patient comes in today for recheck of his diabetes. Patient has been currently taking metformin and glimepiride and blood sugars 120-134. Patient is currently on an ACE inhibitor/ARB. Patient has not seen an ophthalmologist this year. Patient denies any issues with their feet.   Hypertension Patient is currently on lisinopril, and their blood pressure today is unknown. Patient denies any lightheadedness or dizziness. Patient denies headaches, blurred vision, chest pains, shortness of breath, or weakness. Denies any side effects from medication and is content with current medication.   Hypothyroidism recheck Patient is coming in for thyroid recheck today as well. They deny any issues with hair changes or heat or cold problems or diarrhea or constipation. They deny any chest pain or palpitations. They are currently on levothyroxine 154micrograms   Outpatient Encounter Medications as of 01/04/2019  Medication Sig  . glimepiride (AMARYL) 4 MG tablet Take 1 tablet (4 mg total) by mouth daily before breakfast.  . levothyroxine (SYNTHROID, LEVOTHROID) 125 MCG tablet Take 1 tablet (125 mcg total) by mouth daily.  Marland Kitchen lisinopril  (PRINIVIL,ZESTRIL) 10 MG tablet Take 1 tablet (10 mg total) by mouth daily.  . metFORMIN (GLUCOPHAGE) 1000 MG tablet Take 1 tablet (1,000 mg total) by mouth 2 (two) times daily with a meal.   No facility-administered encounter medications on file as of 01/04/2019.     Review of Systems  Constitutional: Negative for chills and fever.  Eyes: Negative for visual disturbance.  Respiratory: Negative for shortness of breath and wheezing.   Cardiovascular: Negative for chest pain and leg swelling.  Musculoskeletal: Negative for back pain and gait problem.  Skin: Negative for rash.  Neurological: Negative for dizziness, weakness and light-headedness.  All other systems reviewed and are negative.   Observations/Objective: Patient sounds comfortable and in no acute distress  Assessment and Plan: Problem List Items Addressed This Visit      Cardiovascular and Mediastinum   Hypertension associated with diabetes ([place])   Relevant Medications   glimepiride (AMARYL) 4 MG tablet   lisinopril (ZESTRIL) 10 MG tablet   metFORMIN (GLUCOPHAGE) 1000 MG tablet     Endocrine   Type 2 diabetes mellitus with other specified complication (HCC) - Primary   Relevant Medications   glimepiride (AMARYL) 4 MG tablet   lisinopril (ZESTRIL) 10 MG tablet   metFORMIN (GLUCOPHAGE) 1000 MG tablet   Hypothyroidism   Relevant Medications   levothyroxine (SYNTHROID) 125 MCG tablet    Other Visit Diagnoses    Essential hypertension, benign       Relevant Medications   lisinopril (ZESTRIL) 10 MG tablet       Follow Up Instructions:  Follow up in 3 months.  Continue current medications, patient denies any issues, follow-up in 3 months  I discussed the assessment and treatment plan with the patient. The patient was provided an opportunity to ask questions and all were answered. The patient agreed with the plan and demonstrated an understanding of the instructions.   The patient was advised to call back or  seek an in-person evaluation if the symptoms worsen or if the condition fails to improve as anticipated.  The above assessment and management plan was discussed with the patient. The patient verbalized understanding of and has agreed to the management plan. Patient is aware to call the clinic if symptoms persist or worsen. Patient is aware when to return to the clinic for a follow-up visit. Patient educated on when it is appropriate to go to the emergency department.    I provided 13 minutes of non-face-to-face time during this encounter.    Worthy Rancher, MD

## 2019-07-12 ENCOUNTER — Other Ambulatory Visit: Payer: Self-pay

## 2019-07-13 ENCOUNTER — Ambulatory Visit (INDEPENDENT_AMBULATORY_CARE_PROVIDER_SITE_OTHER): Payer: Medicare Other

## 2019-07-13 ENCOUNTER — Other Ambulatory Visit: Payer: Self-pay

## 2019-07-13 DIAGNOSIS — Z23 Encounter for immunization: Secondary | ICD-10-CM

## 2019-07-13 NOTE — Addendum Note (Signed)
Addended by: Michaela Corner on: 07/13/2019 09:05 AM   Modules accepted: Orders

## 2020-03-20 ENCOUNTER — Other Ambulatory Visit: Payer: Self-pay | Admitting: Family Medicine

## 2020-03-20 DIAGNOSIS — I10 Essential (primary) hypertension: Secondary | ICD-10-CM

## 2020-03-20 MED ORDER — LISINOPRIL 10 MG PO TABS: 10.0000 mg | ORAL_TABLET | Freq: Every day | ORAL | 0 refills | Status: DC

## 2020-03-20 MED ORDER — LEVOTHYROXINE SODIUM 125 MCG PO TABS: 125.0000 ug | ORAL_TABLET | Freq: Every day | ORAL | 0 refills | Status: DC

## 2020-03-20 MED ORDER — METFORMIN HCL 1000 MG PO TABS: 1000.0000 mg | ORAL_TABLET | Freq: Two times a day (BID) | ORAL | 0 refills | Status: DC

## 2020-03-20 MED ORDER — GLIMEPIRIDE 4 MG PO TABS: 4.0000 mg | ORAL_TABLET | Freq: Every day | ORAL | 0 refills | Status: DC

## 2020-03-20 NOTE — Addendum Note (Signed)
Addended by: Antonietta Barcelona D on: 03/20/2020 11:47 AM   Modules accepted: Orders

## 2020-03-20 NOTE — Telephone Encounter (Signed)
Pt is wanting to see if his glimepiride, levothyroxine, lisinopril, and metformin can be refilled before his appt on 8/18.

## 2020-04-19 ENCOUNTER — Ambulatory Visit (INDEPENDENT_AMBULATORY_CARE_PROVIDER_SITE_OTHER): Payer: Medicare Other | Admitting: Family Medicine

## 2020-04-19 ENCOUNTER — Encounter: Payer: Self-pay | Admitting: Family Medicine

## 2020-04-19 ENCOUNTER — Other Ambulatory Visit: Payer: Self-pay

## 2020-04-19 VITALS — BP 131/74 | HR 68 | Temp 97.9°F | Ht 68.5 in | Wt 182.0 lb

## 2020-04-19 DIAGNOSIS — E1159 Type 2 diabetes mellitus with other circulatory complications: Secondary | ICD-10-CM | POA: Diagnosis not present

## 2020-04-19 DIAGNOSIS — E11621 Type 2 diabetes mellitus with foot ulcer: Secondary | ICD-10-CM

## 2020-04-19 DIAGNOSIS — E1169 Type 2 diabetes mellitus with other specified complication: Secondary | ICD-10-CM | POA: Diagnosis not present

## 2020-04-19 DIAGNOSIS — M21612 Bunion of left foot: Secondary | ICD-10-CM

## 2020-04-19 DIAGNOSIS — L97521 Non-pressure chronic ulcer of other part of left foot limited to breakdown of skin: Secondary | ICD-10-CM

## 2020-04-19 DIAGNOSIS — E038 Other specified hypothyroidism: Secondary | ICD-10-CM

## 2020-04-19 DIAGNOSIS — I10 Essential (primary) hypertension: Secondary | ICD-10-CM

## 2020-04-19 LAB — BAYER DCA HB A1C WAIVED: HB A1C (BAYER DCA - WAIVED): 12 % — ABNORMAL HIGH (ref <7.0)

## 2020-04-19 MED ORDER — GLIMEPIRIDE 4 MG PO TABS: 4.0000 mg | ORAL_TABLET | Freq: Every day | ORAL | 3 refills | Status: DC

## 2020-04-19 MED ORDER — LEVOTHYROXINE SODIUM 125 MCG PO TABS: 125.0000 ug | ORAL_TABLET | Freq: Every day | ORAL | 30 refills | Status: DC

## 2020-04-19 MED ORDER — LISINOPRIL 10 MG PO TABS: 10.0000 mg | ORAL_TABLET | Freq: Every day | ORAL | 3 refills | Status: DC

## 2020-04-19 MED ORDER — METFORMIN HCL 1000 MG PO TABS: 1000.0000 mg | ORAL_TABLET | Freq: Two times a day (BID) | ORAL | 3 refills | Status: DC

## 2020-04-19 NOTE — Progress Notes (Signed)
BP 131/74   Pulse 68   Temp 97.9 F (36.6 C)   Ht 5' 8.5" (1.74 m)   Wt 182 lb (82.6 kg)   SpO2 96%   BMI 27.27 kg/m    Subjective:   Patient ID: Patrick [name], male    DOB: 02-Sep-1946, 74 y.o.   MRN: [id number]  HPI: Patrick [name] is a 74 y.o. male presenting on 04/19/2020 for Medical Management of Chronic Issues and Diabetes   HPI Type 2 diabetes mellitus Patient comes in today for recheck of his diabetes. Patient has been currently taking glimepiride and Metformin and says that his blood sugars are running between 122 -135. Patient is currently on an ACE inhibitor/ARB. Patient has not seen an ophthalmologist this year. Patient has some sores on his left foot and bunion. The symptom started onset as an adult hypertension and thyroid ARE RELATED TO DM   Hypertension Patient is currently on lisinopril, and their blood pressure today is 131/74. Patient denies any lightheadedness or dizziness. Patient denies headaches, blurred vision, chest pains, shortness of breath, or weakness. Denies any side effects from medication and is content with current medication.  Hypothyroidism recheck Patient is coming in for thyroid recheck today as well. They deny any issues with hair changes or heat or cold problems or diarrhea or constipation. They deny any chest pain or palpitations. They are currently on levothyroxine 125 micrograms   Patient has started to develop sores on his left foot where callus was on the plantar surface just proximal to the great toe, he also has some overlapping of his toes and gets cracking and peeling between them, he denies any pain or fevers or chills.  Relevant past medical, surgical, family and social history reviewed and updated as indicated. Interim medical history since our last visit reviewed. Allergies and medications reviewed and updated.  Review of Systems  Constitutional: Negative for chills and fever.  Respiratory: Negative for shortness of breath and  wheezing.   Cardiovascular: Negative for chest pain and leg swelling.  Musculoskeletal: Negative for back pain and gait problem.  Skin: Positive for color change and wound. Negative for rash.  Neurological: Negative for dizziness, weakness and numbness.  All other systems reviewed and are negative.   Per HPI unless specifically indicated above   Allergies as of 04/19/2020      Reactions   Codeine       Medication List       Accurate as of April 19, 2020  9:40 AM. If you have any questions, ask your nurse or doctor.        glimepiride 4 MG tablet Commonly known as: AMARYL Take 1 tablet (4 mg total) by mouth daily before breakfast.   levothyroxine 125 MCG tablet Commonly known as: SYNTHROID Take 1 tablet (125 mcg total) by mouth daily.   lisinopril 10 MG tablet Commonly known as: ZESTRIL Take 1 tablet (10 mg total) by mouth daily.   metFORMIN 1000 MG tablet Commonly known as: Glucophage Take 1 tablet (1,000 mg total) by mouth 2 (two) times daily with a meal.        Objective:   BP 131/74   Pulse 68   Temp 97.9 F (36.6 C)   Ht 5' 8.5" (1.74 m)   Wt 182 lb (82.6 kg)   SpO2 96%   BMI 27.27 kg/m   Wt Readings from Last 3 Encounters:  04/19/20 182 lb (82.6 kg)  08/27/17 187 lb (84.8 kg)  08/21/17 186 lb (84.4  kg)    Physical Exam Vitals and nursing note reviewed.  Constitutional:      General: He is not in acute distress.    Appearance: He is well-developed. He is not diaphoretic.  Eyes:     General: No scleral icterus.    Conjunctiva/sclera: Conjunctivae normal.  Neck:     Thyroid: No thyromegaly.  Cardiovascular:     Rate and Rhythm: Normal rate and regular rhythm.     Heart sounds: Normal heart sounds. No murmur heard.   Pulmonary:     Effort: Pulmonary effort is normal. No respiratory distress.     Breath sounds: Normal breath sounds. No wheezing.  Musculoskeletal:        General: Normal range of motion.     Cervical back: Neck supple.    Lymphadenopathy:     Cervical: No cervical adenopathy.  Skin:    General: Skin is warm and dry.     Findings: Wound present. No rash.  Neurological:     Mental Status: He is alert and oriented to person, place, and time.     Coordination: Coordination normal.  Psychiatric:        Behavior: Behavior normal.    Diabetic Foot Exam - Simple   Simple Foot Form Diabetic Foot exam was performed with the following findings: Yes 04/19/2020  9:23 AM  Visual Inspection See comments: Yes Sensation Testing Intact to touch and monofilament testing bilaterally: Yes Pulse Check Posterior Tibialis and Dorsalis pulse intact bilaterally: Yes Comments On patient's left foot he has extreme bunion leading to overlapping of his toes and between the second third and fourth toes he has cracking and peeling and starting to develop a superficial ulceration, placed spacers between his toes with gauze.  We will do urgent podiatry referral.  Patient also has a scabbed over blister over the callus on the plantar surface of his left foot just proximal to the great toe       Assessment & Plan:   Problem List Items Addressed This Visit      Cardiovascular and Mediastinum   Hypertension associated with diabetes ([place])   Relevant Medications   glimepiride (AMARYL) 4 MG tablet   lisinopril (ZESTRIL) 10 MG tablet   metFORMIN (GLUCOPHAGE) 1000 MG tablet   Other Relevant Orders   CBC with Differential/Platelet   CMP14+EGFR   Lipid panel     Endocrine   Type 2 diabetes mellitus with other specified complication ([place]) - Primary   Relevant Medications   glimepiride (AMARYL) 4 MG tablet   lisinopril (ZESTRIL) 10 MG tablet   metFORMIN (GLUCOPHAGE) 1000 MG tablet   Other Relevant Orders   Bayer DCA Hb A1c Waived (Completed)   CBC with Differential/Platelet   CMP14+EGFR   Lipid panel   Ambulatory referral to Podiatry   Hypothyroidism   Relevant Medications   levothyroxine (SYNTHROID) 125 MCG tablet   Other  Relevant Orders   TSH    Other Visit Diagnoses    Essential hypertension, benign       Relevant Medications   lisinopril (ZESTRIL) 10 MG tablet   Other Relevant Orders   CMP14+EGFR   Diabetic ulcer of toe of left foot associated with type 2 diabetes mellitus, limited to breakdown of skin (HCC)       Relevant Medications   glimepiride (AMARYL) 4 MG tablet   lisinopril (ZESTRIL) 10 MG tablet   metFORMIN (GLUCOPHAGE) 1000 MG tablet   Other Relevant Orders   Ambulatory referral to Podiatry  Bunion of great toe of left foot       Relevant Orders   Ambulatory referral to Podiatry      Placed spacers between his toes on his left foot, recommended to change and replace daily, will do urgent referral to podiatry because his burning is likely needs it to be fixed to prevent this in the future.  Patient's A1c is 12, will set up with Lottie Dawson, he says he was only getting the Metformin once a day so we have made sure it is twice a day.. Follow up plan: Return in about 3 months (around 07/20/2020), or if symptoms worsen or fail to improve, for Diabetic recheck and thyroid.  Counseling provided for all of the vaccine components Orders Placed This Encounter  Procedures  . Bayer DCA Hb A1c Waived  . CBC with Differential/Platelet  . CMP14+EGFR  . Lipid panel  . TSH  . Ambulatory referral to [place] Anthony Roland, MD [facility] 04/19/2020, 9:40 AM

## 2020-04-20 LAB — CBC WITH DIFFERENTIAL/PLATELET
Basophils Absolute: 0.1 10*3/uL (ref 0.0–0.2)
Basos: 1 %
EOS (ABSOLUTE): 0.2 10*3/uL (ref 0.0–0.4)
Eos: 3 %
Hematocrit: 41.7 % (ref 37.5–51.0)
Hemoglobin: 13.2 g/dL (ref 13.0–17.7)
Immature Grans (Abs): 0 10*3/uL (ref 0.0–0.1)
Immature Granulocytes: 0 %
Lymphocytes Absolute: 1.5 10*3/uL (ref 0.7–3.1)
Lymphs: 25 %
MCH: 29.3 pg (ref 26.6–33.0)
MCHC: 31.7 g/dL (ref 31.5–35.7)
MCV: 93 fL (ref 79–97)
Monocytes Absolute: 0.6 10*3/uL (ref 0.1–0.9)
Monocytes: 10 %
Neutrophils Absolute: 3.5 10*3/uL (ref 1.4–7.0)
Neutrophils: 61 %
Platelets: 109 10*3/uL — ABNORMAL LOW (ref 150–450)
RBC: 4.5 x10E6/uL (ref 4.14–5.80)
RDW: 13.5 % (ref 11.6–15.4)
WBC: 5.7 10*3/uL (ref 3.4–10.8)

## 2020-04-20 LAB — CMP14+EGFR
ALT: 21 IU/L (ref 0–44)
AST: 21 IU/L (ref 0–40)
Albumin/Globulin Ratio: 1.3 (ref 1.2–2.2)
Albumin: 4.1 g/dL (ref 3.7–4.7)
Alkaline Phosphatase: 55 IU/L (ref 48–121)
BUN/Creatinine Ratio: 16 (ref 10–24)
BUN: 21 mg/dL (ref 8–27)
Bilirubin Total: 0.6 mg/dL (ref 0.0–1.2)
CO2: 24 mmol/L (ref 20–29)
Calcium: 9.2 mg/dL (ref 8.6–10.2)
Chloride: 102 mmol/L (ref 96–106)
Creatinine, Ser: 1.29 mg/dL — ABNORMAL HIGH (ref 0.76–1.27)
GFR calc Af Amer: 64 mL/min/{1.73_m2} (ref >59)
GFR calc non Af Amer: 55 mL/min/{1.73_m2} — ABNORMAL LOW (ref >59)
Globulin, Total: 3.1 g/dL (ref 1.5–4.5)
Glucose: 204 mg/dL — ABNORMAL HIGH (ref 65–99)
Potassium: 4.4 mmol/L (ref 3.5–5.2)
Sodium: 137 mmol/L (ref 134–144)
Total Protein: 7.2 g/dL (ref 6.0–8.5)

## 2020-04-20 LAB — TSH: TSH: 3.25 u[IU]/mL (ref 0.450–4.500)

## 2020-04-20 LAB — LIPID PANEL
Chol/HDL Ratio: 3.5 ratio (ref 0.0–5.0)
Cholesterol, Total: 140 mg/dL (ref 100–199)
HDL: 40 mg/dL (ref >39)
LDL Chol Calc (NIH): 78 mg/dL (ref 0–99)
Triglycerides: 124 mg/dL (ref 0–149)
VLDL Cholesterol Cal: 22 mg/dL (ref 5–40)

## 2020-05-03 ENCOUNTER — Encounter: Payer: Self-pay | Admitting: *Deleted

## 2020-05-03 ENCOUNTER — Encounter: Payer: Medicare Other | Admitting: Pharmacist

## 2020-05-03 NOTE — Progress Notes (Signed)
This encounter was created in error - please disregard.

## 2020-07-20 ENCOUNTER — Ambulatory Visit: Payer: Medicare Other | Admitting: Family Medicine

## 2020-07-25 ENCOUNTER — Encounter: Payer: Self-pay | Admitting: Family Medicine

## 2022-06-20 ENCOUNTER — Encounter: Payer: Self-pay | Admitting: Family Medicine

## 2022-06-20 ENCOUNTER — Ambulatory Visit (INDEPENDENT_AMBULATORY_CARE_PROVIDER_SITE_OTHER): Payer: Medicare Other | Admitting: Family Medicine

## 2022-06-20 VITALS — BP 158/76 | HR 69 | Temp 98.0°F | Ht 68.5 in | Wt 175.0 lb

## 2022-06-20 DIAGNOSIS — E038 Other specified hypothyroidism: Secondary | ICD-10-CM

## 2022-06-20 DIAGNOSIS — E1169 Type 2 diabetes mellitus with other specified complication: Secondary | ICD-10-CM | POA: Diagnosis not present

## 2022-06-20 DIAGNOSIS — I10 Essential (primary) hypertension: Secondary | ICD-10-CM

## 2022-06-20 DIAGNOSIS — E1159 Type 2 diabetes mellitus with other circulatory complications: Secondary | ICD-10-CM | POA: Diagnosis not present

## 2022-06-20 DIAGNOSIS — I152 Hypertension secondary to endocrine disorders: Secondary | ICD-10-CM | POA: Diagnosis not present

## 2022-06-20 LAB — BAYER DCA HB A1C WAIVED: HB A1C (BAYER DCA - WAIVED): 14 % — ABNORMAL HIGH (ref 4.8–5.6)

## 2022-06-20 MED ORDER — METFORMIN HCL 1000 MG PO TABS: 1000.0000 mg | ORAL_TABLET | Freq: Two times a day (BID) | ORAL | 1 refills | Status: DC

## 2022-06-20 MED ORDER — LISINOPRIL 10 MG PO TABS: 10.0000 mg | ORAL_TABLET | Freq: Every day | ORAL | 1 refills | Status: DC

## 2022-06-20 MED ORDER — LEVOTHYROXINE SODIUM 125 MCG PO TABS: 125.0000 ug | ORAL_TABLET | Freq: Every day | ORAL | 1 refills | Status: DC

## 2022-06-20 MED ORDER — GLIMEPIRIDE 4 MG PO TABS: 4.0000 mg | ORAL_TABLET | Freq: Every day | ORAL | 1 refills | Status: DC

## 2022-06-20 NOTE — Progress Notes (Signed)
BP (!) 158/76   Pulse 69   Temp 98 F (36.7 C)   Ht 5' 8.5" (1.74 m)   Wt 175 lb (79.4 kg)   SpO2 99%   BMI 26.22 kg/m    Subjective:   Patient ID: Patrick [name], male    DOB: 1946/06/04, 76 y.o.   MRN: [id number]  HPI: Patrick [name] is a 76 y.o. male presenting on 06/20/2022 for Medical Management of Chronic Issues, Diabetes, and Hypertension   HPI Type 2 diabetes mellitus Patient comes in today for recheck of his diabetes. Patient has been currently taking no medicine, has been out of his medicine for couple years, restart metformin and glimepiride. Patient is currently on an ACE inhibitor/ARB. Patient has not seen an ophthalmologist this year. Patient does have a blister that he is under treatment for on his foot, he did see somebody for a week ago and is on an antibiotic and has a follow-up with them tomorrow so he did not want to take it off here today but he will follow-up with [place] if they discharged him.. The symptom started onset as an adult hypertension and hypothyroidism ARE RELATED TO DM   Hypertension Patient is currently on no medications currently, was previously on lisinopril and will restart, and their blood pressure today is 158/76. Patient denies any lightheadedness or dizziness. Patient denies headaches, blurred vision, chest pains, shortness of breath, or weakness. Denies any side effects from medication and is content with current medication.   Hypothyroidism recheck Patient is coming in for thyroid recheck today as well. They deny any issues with hair changes or heat or cold problems or diarrhea or constipation. They deny any chest pain or palpitations. They are currently on levothyroxine 125 micrograms   Relevant past medical, surgical, family and social history reviewed and updated as indicated. Interim medical history since our last visit reviewed. Allergies and medications reviewed and updated.  Review of Systems  Constitutional:  Negative for chills  and fever.  Eyes:  Negative for visual disturbance.  Respiratory:  Negative for shortness of breath and wheezing.   Cardiovascular:  Negative for chest pain and leg swelling.  Musculoskeletal:  Negative for back pain and gait problem.  Skin:  Negative for rash.  Neurological:  Negative for dizziness, weakness and light-headedness.  All other systems reviewed and are negative.   Per HPI unless specifically indicated above   Allergies as of 06/20/2022       Reactions   Codeine         Medication List        Accurate as of June 20, 2022  9:12 AM. If you have any questions, ask your nurse or doctor.          glimepiride 4 MG tablet Commonly known as: AMARYL Take 1 tablet (4 mg total) by mouth daily before breakfast.   levothyroxine 125 MCG tablet Commonly known as: SYNTHROID Take 1 tablet (125 mcg total) by mouth daily.   lisinopril 10 MG tablet Commonly known as: ZESTRIL Take 1 tablet (10 mg total) by mouth daily.   metFORMIN 1000 MG tablet Commonly known as: Glucophage Take 1 tablet (1,000 mg total) by mouth 2 (two) times daily with a meal.         Objective:   BP (!) 158/76   Pulse 69   Temp 98 F (36.7 C)   Ht 5' 8.5" (1.74 m)   Wt 175 lb (79.4 kg)   SpO2 99%   BMI 26.22  kg/m   Wt Readings from Last 3 Encounters:  06/20/22 175 lb (79.4 kg)  04/19/20 182 lb (82.6 kg)  08/27/17 187 lb (84.8 kg)    Physical Exam Vitals and nursing note reviewed.  Constitutional:      General: He is not in acute distress.    Appearance: He is well-developed. He is not diaphoretic.  Eyes:     General: No scleral icterus.    Conjunctiva/sclera: Conjunctivae normal.  Neck:     Thyroid: No thyromegaly.  Cardiovascular:     Rate and Rhythm: Normal rate and regular rhythm.     Heart sounds: Normal heart sounds. No murmur heard. Pulmonary:     Effort: Pulmonary effort is normal. No respiratory distress.     Breath sounds: Normal breath sounds. No wheezing.   Musculoskeletal:     Cervical back: Neck supple.  Lymphadenopathy:     Cervical: No cervical adenopathy.  Skin:    General: Skin is warm and dry.     Findings: No rash.  Neurological:     Mental Status: He is alert and oriented to person, place, and time.     Coordination: Coordination normal.  Psychiatric:        Behavior: Behavior normal.       Assessment & Plan:   Problem List Items Addressed This Visit       Cardiovascular and Mediastinum   Hypertension associated with diabetes ([place])   Relevant Medications   metFORMIN (GLUCOPHAGE) 1000 MG tablet   lisinopril (ZESTRIL) 10 MG tablet   glimepiride (AMARYL) 4 MG tablet   Other Relevant Orders   CBC with Differential/Platelet   CMP14+EGFR   Lipid panel   TSH   Bayer DCA Hb A1c Waived   AMB Referral to Chronic Care Management Services     Endocrine   Type 2 diabetes mellitus with other specified complication (HCC)   Relevant Medications   metFORMIN (GLUCOPHAGE) 1000 MG tablet   lisinopril (ZESTRIL) 10 MG tablet   glimepiride (AMARYL) 4 MG tablet   Other Relevant Orders   CBC with Differential/Platelet   CMP14+EGFR   Lipid panel   TSH   Bayer DCA Hb A1c Waived   AMB Referral to Chronic Care Management Services   Hypothyroidism   Relevant Medications   levothyroxine (SYNTHROID) 125 MCG tablet   Other Relevant Orders   CBC with Differential/Platelet   CMP14+EGFR   Lipid panel   TSH   Bayer DCA Hb A1c Waived   Other Visit Diagnoses     Essential hypertension, benign    -  Primary   Relevant Medications   lisinopril (ZESTRIL) 10 MG tablet   Other Relevant Orders   AMB Referral to Chronic Care Management Services     Patient has a sore on his foot but he does not want to show today, he says it is wrapped up and he is following up with the office that wrapped it tomorrow.  Follow up plan: Return in about 3 months (around 09/20/2022), or if symptoms worsen or fail to improve, for Get an appoint with Almyra Free  and then also to see me in 3 months..  Counseling provided for all of the vaccine components Orders Placed This Encounter  Procedures   CBC with Differential/Platelet   CMP14+EGFR   Lipid panel   TSH   Bayer DCA Hb A1c Waived   AMB Referral to Chronic Care Management Services    Caryl Pina, MD [facility] 06/20/2022, 9:12 AM

## 2022-06-21 LAB — CBC WITH DIFFERENTIAL/PLATELET
Basophils Absolute: 0.1 10*3/uL (ref 0.0–0.2)
Basos: 1 %
EOS (ABSOLUTE): 0.1 10*3/uL (ref 0.0–0.4)
Eos: 3 %
Hematocrit: 42.1 % (ref 37.5–51.0)
Hemoglobin: 13.4 g/dL (ref 13.0–17.7)
Immature Grans (Abs): 0 10*3/uL (ref 0.0–0.1)
Immature Granulocytes: 0 %
Lymphocytes Absolute: 1.2 10*3/uL (ref 0.7–3.1)
Lymphs: 25 %
MCH: 30.9 pg (ref 26.6–33.0)
MCHC: 31.8 g/dL (ref 31.5–35.7)
MCV: 97 fL (ref 79–97)
Monocytes Absolute: 0.4 10*3/uL (ref 0.1–0.9)
Monocytes: 8 %
Neutrophils Absolute: 3.1 10*3/uL (ref 1.4–7.0)
Neutrophils: 63 %
Platelets: 142 10*3/uL — ABNORMAL LOW (ref 150–450)
RBC: 4.33 x10E6/uL (ref 4.14–5.80)
RDW: 12.3 % (ref 11.6–15.4)
WBC: 5 10*3/uL (ref 3.4–10.8)

## 2022-06-21 LAB — CMP14+EGFR
ALT: 16 IU/L (ref 0–44)
AST: 16 IU/L (ref 0–40)
Albumin/Globulin Ratio: 1.3 (ref 1.2–2.2)
Albumin: 4 g/dL (ref 3.8–4.8)
Alkaline Phosphatase: 85 IU/L (ref 44–121)
BUN/Creatinine Ratio: 10 (ref 10–24)
BUN: 14 mg/dL (ref 8–27)
Bilirubin Total: 0.8 mg/dL (ref 0.0–1.2)
CO2: 27 mmol/L (ref 20–29)
Calcium: 9.6 mg/dL (ref 8.6–10.2)
Chloride: 97 mmol/L (ref 96–106)
Creatinine, Ser: 1.4 mg/dL — ABNORMAL HIGH (ref 0.76–1.27)
Globulin, Total: 3.2 g/dL (ref 1.5–4.5)
Glucose: 458 mg/dL (ref 70–99)
Potassium: 4.7 mmol/L (ref 3.5–5.2)
Sodium: 136 mmol/L (ref 134–144)
Total Protein: 7.2 g/dL (ref 6.0–8.5)
eGFR: 53 mL/min/{1.73_m2} — ABNORMAL LOW (ref >59)

## 2022-06-21 LAB — LIPID PANEL
Chol/HDL Ratio: 3.4 ratio (ref 0.0–5.0)
Cholesterol, Total: 174 mg/dL (ref 100–199)
HDL: 51 mg/dL (ref >39)
LDL Chol Calc (NIH): 99 mg/dL (ref 0–99)
Triglycerides: 135 mg/dL (ref 0–149)
VLDL Cholesterol Cal: 24 mg/dL (ref 5–40)

## 2022-06-21 LAB — TSH: TSH: 31.2 u[IU]/mL — ABNORMAL HIGH (ref 0.450–4.500)

## 2022-07-04 ENCOUNTER — Ambulatory Visit: Payer: Medicare Other | Admitting: Pharmacist

## 2022-07-17 ENCOUNTER — Telehealth: Payer: Self-pay

## 2022-07-17 NOTE — Progress Notes (Signed)
  Chronic Care Management   Note  07/17/2022 Name: Wasil Wolke MRN: [id number] DOB: 1946/05/09  Tallen Schnorr is a 76 y.o. year old male who is a primary care patient of Dettinger, Fransisca Kaufmann, MD. I reached out to [employer] by phone today in response to a referral sent by Mr. Jene Proud's PCP.  Mr. Domique Clapper was not successfully contacted today. A HIPAA compliant voice message was left requesting a return call.   Follow up plan: Additional outreach attempts will be made.  Noreene Larsson, [place], [place] [address] Direct Dial: [phone] [email or web address]

## 2022-07-29 NOTE — Progress Notes (Signed)
  Chronic Care Management   Note  07/29/2022 Name: Patrick [name] MRN: [id number] DOB: 03/19/1946  Willam Munford is a 76 y.o. year old male who is a primary care patient of Dettinger, Fransisca Kaufmann, MD. I reached out to [employer] by phone today in response to a referral sent by Mr. Tyriek Arras's PCP.  Mr. Renaldo Stapel  declined to scheduling an appointment with the CCM RN Case Manager and Pharm D   Follow up plan: Patient did not agree to scheduling an appointment with the RN Case Manager and Pharm D. The ordering provider has been notified.   Noreene Larsson, [place],  [address] Direct Dial: [phone] [email or web address]

## 2022-09-20 ENCOUNTER — Ambulatory Visit: Payer: Medicare Other | Admitting: Family Medicine

## 2022-09-20 DIAGNOSIS — E038 Other specified hypothyroidism: Secondary | ICD-10-CM

## 2022-09-20 DIAGNOSIS — E1169 Type 2 diabetes mellitus with other specified complication: Secondary | ICD-10-CM

## 2022-09-25 ENCOUNTER — Encounter: Payer: Self-pay | Admitting: Family Medicine

## 2022-09-25 ENCOUNTER — Ambulatory Visit (INDEPENDENT_AMBULATORY_CARE_PROVIDER_SITE_OTHER): Payer: Medicare Other | Admitting: Family Medicine

## 2022-09-25 VITALS — BP 126/73 | HR 70 | Ht 68.5 in | Wt 188.0 lb

## 2022-09-25 DIAGNOSIS — I152 Hypertension secondary to endocrine disorders: Secondary | ICD-10-CM

## 2022-09-25 DIAGNOSIS — E1159 Type 2 diabetes mellitus with other circulatory complications: Secondary | ICD-10-CM | POA: Diagnosis not present

## 2022-09-25 DIAGNOSIS — N1831 Chronic kidney disease, stage 3a: Secondary | ICD-10-CM

## 2022-09-25 DIAGNOSIS — E1169 Type 2 diabetes mellitus with other specified complication: Secondary | ICD-10-CM | POA: Diagnosis not present

## 2022-09-25 DIAGNOSIS — E11621 Type 2 diabetes mellitus with foot ulcer: Secondary | ICD-10-CM

## 2022-09-25 DIAGNOSIS — E038 Other specified hypothyroidism: Secondary | ICD-10-CM | POA: Diagnosis not present

## 2022-09-25 DIAGNOSIS — L97421 Non-pressure chronic ulcer of left heel and midfoot limited to breakdown of skin: Secondary | ICD-10-CM

## 2022-09-25 DIAGNOSIS — N183 Chronic kidney disease, stage 3 unspecified: Secondary | ICD-10-CM | POA: Insufficient documentation

## 2022-09-25 DIAGNOSIS — N1832 Chronic kidney disease, stage 3b: Secondary | ICD-10-CM | POA: Insufficient documentation

## 2022-09-25 LAB — CBC WITH DIFFERENTIAL/PLATELET
Basophils Absolute: 0.1 10*3/uL (ref 0.0–0.2)
Basos: 1 %
EOS (ABSOLUTE): 0.2 10*3/uL (ref 0.0–0.4)
Eos: 3 %
Hematocrit: 36.5 % — ABNORMAL LOW (ref 37.5–51.0)
Hemoglobin: 11.9 g/dL — ABNORMAL LOW (ref 13.0–17.7)
Immature Grans (Abs): 0 10*3/uL (ref 0.0–0.1)
Immature Granulocytes: 0 %
Lymphocytes Absolute: 1.4 10*3/uL (ref 0.7–3.1)
Lymphs: 21 %
MCH: 31.6 pg (ref 26.6–33.0)
MCHC: 32.6 g/dL (ref 31.5–35.7)
MCV: 97 fL (ref 79–97)
Monocytes Absolute: 0.4 10*3/uL (ref 0.1–0.9)
Monocytes: 6 %
Neutrophils Absolute: 4.4 10*3/uL (ref 1.4–7.0)
Neutrophils: 69 %
Platelets: 138 10*3/uL — ABNORMAL LOW (ref 150–450)
RBC: 3.76 x10E6/uL — ABNORMAL LOW (ref 4.14–5.80)
RDW: 12.7 % (ref 11.6–15.4)
WBC: 6.4 10*3/uL (ref 3.4–10.8)

## 2022-09-25 LAB — BAYER DCA HB A1C WAIVED: HB A1C (BAYER DCA - WAIVED): 6.5 % — ABNORMAL HIGH (ref 4.8–5.6)

## 2022-09-25 NOTE — Progress Notes (Signed)
BP 126/73   Pulse 70   Ht 5' 8.5" (1.74 m)   Wt 188 lb (85.3 kg) Comment: Unable to stand/wheelchair  SpO2 98%   BMI 28.17 kg/m    Subjective:   Patient ID: Patrick [name], male    DOB: 05-14-1946, 77 y.o.   MRN: [id number]  HPI: Patrick [name] is a 77 y.o. male presenting on 09/25/2022 for Medical Management of Chronic Issues, Diabetes, and Hypertension   HPI Type 2 diabetes mellitus Patient comes in today for recheck of his diabetes. Patient has been currently taking glimepiride and metformin, patient says he is actually taking them again now. Patient is currently on an ACE inhibitor/ARB. Patient has not seen an ophthalmologist this year. Patient complains of a callus and a small opening on the center of the callus on his left foot. The symptom started onset as an adult CKD 3 and hypertension and hypothyroidism ARE RELATED TO DM   Hypertension Patient is currently on lisinopril, and their blood pressure today is 126/73. Patient denies any lightheadedness or dizziness. Patient denies headaches, blurred vision, chest pains, shortness of breath, or weakness. Denies any side effects from medication and is content with current medication.   Hypothyroidism recheck Patient is coming in for thyroid recheck today as well. They deny any issues with hair changes or heat or cold problems or diarrhea or constipation. They deny any chest pain or palpitations. They are currently on levothyroxine 125 micrograms   Patient has a callus on the bottom of his left foot on the floor pad near the base of the big toe that has a crack in it but it opened up just a little bit.  He is putting antibiotic cream on it  Relevant past medical, surgical, family and social history reviewed and updated as indicated. Interim medical history since our last visit reviewed. Allergies and medications reviewed and updated.  Review of Systems  Constitutional:  Negative for chills and fever.  Eyes:  Negative for visual  disturbance.  Respiratory:  Negative for shortness of breath and wheezing.   Cardiovascular:  Negative for chest pain and leg swelling.  Musculoskeletal:  Negative for back pain and gait problem.  Skin:  Positive for wound. Negative for color change and rash.  Neurological:  Negative for dizziness, weakness and light-headedness.  All other systems reviewed and are negative.   Per HPI unless specifically indicated above   Allergies as of 09/25/2022       Reactions   Codeine         Medication List        Accurate as of September 25, 2022 11:53 AM. If you have any questions, ask your nurse or doctor.          glimepiride 4 MG tablet Commonly known as: AMARYL Take 1 tablet (4 mg total) by mouth daily before breakfast.   levothyroxine 125 MCG tablet Commonly known as: SYNTHROID Take 1 tablet (125 mcg total) by mouth daily.   lisinopril 10 MG tablet Commonly known as: ZESTRIL Take 1 tablet (10 mg total) by mouth daily.   metFORMIN 1000 MG tablet Commonly known as: Glucophage Take 1 tablet (1,000 mg total) by mouth 2 (two) times daily with a meal.         Objective:   BP 126/73   Pulse 70   Ht 5' 8.5" (1.74 m)   Wt 188 lb (85.3 kg) Comment: Unable to stand/wheelchair  SpO2 98%   BMI 28.17 kg/m   Wt Readings  from Last 3 Encounters:  09/25/22 188 lb (85.3 kg)  06/20/22 175 lb (79.4 kg)  04/19/20 182 lb (82.6 kg)    Physical Exam Vitals and nursing note reviewed.  Constitutional:      General: He is not in acute distress.    Appearance: He is well-developed. He is not diaphoretic.  Eyes:     General: No scleral icterus.    Conjunctiva/sclera: Conjunctivae normal.  Neck:     Thyroid: No thyromegaly.  Cardiovascular:     Rate and Rhythm: Normal rate and regular rhythm.     Heart sounds: Normal heart sounds. No murmur heard. Pulmonary:     Effort: Pulmonary effort is normal. No respiratory distress.     Breath sounds: Normal breath sounds. No wheezing.   Musculoskeletal:        General: No swelling. Normal range of motion.     Cervical back: Neck supple.     Left foot: Deformity and bunion present.  Lymphadenopathy:     Cervical: No cervical adenopathy.  Skin:    General: Skin is warm and dry.     Findings: Wound (Small pinpoint wound is 0.1 cm in depth in the center of his callus on the left forefoot just proximal to the big toe) present. No rash.  Neurological:     Mental Status: He is alert and oriented to person, place, and time.     Coordination: Coordination normal.  Psychiatric:        Behavior: Behavior normal.       Assessment & Plan:   Problem List Items Addressed This Visit       Cardiovascular and Mediastinum   Hypertension associated with diabetes ([place])     Endocrine   Type 2 diabetes mellitus with other specified complication ([place]) - Primary   Relevant Orders   CBC with Differential/Platelet   Microalbumin / creatinine urine ratio   Bayer DCA Hb A1c Waived   TSH   CMP14+EGFR   Ambulatory referral to Podiatry   Hypothyroidism   Relevant Orders   TSH     Genitourinary   CKD (chronic kidney disease), stage III ([place])   Other Visit Diagnoses     Diabetic ulcer of left midfoot associated with type 2 diabetes mellitus, limited to breakdown of skin ([place])       Relevant Orders   Ambulatory referral to Podiatry       Placed urgent referral to podiatry, for now bandage change daily and use antibiotic cream.  Come back if it starts to look worse and you have not been able to go to podiatry. Follow up plan: Return in about 3 months (around 12/25/2022), or if symptoms worsen or fail to improve, for Diabetes recheck.  Counseling provided for all of the vaccine components Orders Placed This Encounter  Procedures   CBC with Differential/Platelet   Microalbumin / creatinine urine ratio   Bayer DCA Hb A1c Waived   TSH   CMP14+EGFR   Ambulatory referral to [place], MD [facility] 09/25/2022, 11:53 AM

## 2022-09-26 LAB — CMP14+EGFR
ALT: 25 IU/L (ref 0–44)
AST: 22 IU/L (ref 0–40)
Albumin/Globulin Ratio: 1.5 (ref 1.2–2.2)
Albumin: 4.4 g/dL (ref 3.8–4.8)
Alkaline Phosphatase: 58 IU/L (ref 44–121)
BUN/Creatinine Ratio: 18 (ref 10–24)
BUN: 27 mg/dL (ref 8–27)
Bilirubin Total: 0.5 mg/dL (ref 0.0–1.2)
CO2: 20 mmol/L (ref 20–29)
Calcium: 9.6 mg/dL (ref 8.6–10.2)
Chloride: 105 mmol/L (ref 96–106)
Creatinine, Ser: 1.54 mg/dL — ABNORMAL HIGH (ref 0.76–1.27)
Globulin, Total: 2.9 g/dL (ref 1.5–4.5)
Glucose: 143 mg/dL — ABNORMAL HIGH (ref 70–99)
Potassium: 5 mmol/L (ref 3.5–5.2)
Sodium: 143 mmol/L (ref 134–144)
Total Protein: 7.3 g/dL (ref 6.0–8.5)
eGFR: 47 mL/min/{1.73_m2} — ABNORMAL LOW (ref >59)

## 2022-09-26 LAB — MICROALBUMIN / CREATININE URINE RATIO
Creatinine, Urine: 153.3 mg/dL
Microalb/Creat Ratio: 424 mg/g creat — ABNORMAL HIGH (ref 0–29)
Microalbumin, Urine: 650.1 ug/mL

## 2022-09-26 LAB — TSH: TSH: 2.68 u[IU]/mL (ref 0.450–4.500)

## 2022-11-13 ENCOUNTER — Ambulatory Visit (INDEPENDENT_AMBULATORY_CARE_PROVIDER_SITE_OTHER): Payer: Medicare Other

## 2022-11-13 VITALS — Ht 70.0 in | Wt 185.0 lb

## 2022-11-13 DIAGNOSIS — Z0100 Encounter for examination of eyes and vision without abnormal findings: Secondary | ICD-10-CM

## 2022-11-13 DIAGNOSIS — Z0000 Encounter for general adult medical examination without abnormal findings: Secondary | ICD-10-CM | POA: Diagnosis not present

## 2022-11-13 NOTE — Progress Notes (Signed)
Subjective:   Patrick [name] is a 77 y.o. male who presents for Medicare Annual/Subsequent preventive examination. I connected with  Patrick [name] on 11/13/22 by a audio enabled telemedicine application and verified that I am speaking with the correct person using two identifiers.  Patient Location: Home  Provider Location: Home Office  I discussed the limitations of evaluation and management by telemedicine. The patient expressed understanding and agreed to proceed.  Review of Systems     Cardiac Risk Factors include: advanced age (>63mn, >>25women);diabetes mellitus;hypertension;dyslipidemia;male gender     Objective:    Today's Vitals   11/13/22 0822  Weight: 185 lb (83.9 kg)  Height: '5\' 10"'$  (1.778 m)   Body mass index is 26.54 kg/m.     11/13/2022    8:27 AM  Advanced Directives  Does Patient Have a Medical Advance Directive? Yes  Type of [place] will  Copy of [place]? No - copy requested    Current Medications (verified) Outpatient Encounter Medications as of 11/13/2022  Medication Sig   glimepiride (AMARYL) 4 MG tablet Take 1 tablet (4 mg total) by mouth daily before breakfast.   levothyroxine (SYNTHROID) 125 MCG tablet Take 1 tablet (125 mcg total) by mouth daily.   lisinopril (ZESTRIL) 10 MG tablet Take 1 tablet (10 mg total) by mouth daily.   metFORMIN (GLUCOPHAGE) 1000 MG tablet Take 1 tablet (1,000 mg total) by mouth 2 (two) times daily with a meal.   No facility-administered encounter medications on file as of 11/13/2022.    Allergies (verified) Codeine   History: Past Medical History:  Diagnosis Date   Diabetes mellitus without complication ([place]    Thyroid disease    Past Surgical History:  Procedure Laterality Date   None     Family History  Problem Relation Age of Onset   Diabetes Mother    Diabetes Brother    Colon cancer Neg Hx    Liver disease Neg Hx     Social History   Socioeconomic History   Marital status: Single    Spouse name: Not on file   Number of children: Not on file   Years of education: Not on file   Highest education level: Not on file  Occupational History   Not on file  Tobacco Use   Smoking status: Never   Smokeless tobacco: Never  Vaping Use   Vaping Use: Never used  Substance and Sexual Activity   Alcohol use: No   Drug use: No   Sexual activity: Not on file  Other Topics Concern   Not on file  Social History Narrative   Not on file   Social Determinants of Health   Financial Resource Strain: Low Risk  (11/13/2022)   Overall Financial Resource Strain (CARDIA)    Difficulty of Paying Living Expenses: Not hard at all  Food Insecurity: No Food Insecurity (11/13/2022)   Hunger Vital Sign    Worried About Running Out of Food in the Last Year: Never true    Ran Out of Food in the Last Year: Never true  Transportation Needs: No Transportation Needs (11/13/2022)   PRAPARE - THydrologist(Medical): No    Lack of Transportation (Non-Medical): No  Physical Activity: Sufficiently Active (11/13/2022)   Exercise Vital Sign    Days of Exercise per Week: 5 days    Minutes of Exercise per Session: 60 min  Stress: No Stress Concern Present (  11/13/2022)   [place]    Feeling of Stress : Not at all  Social Connections: Moderately Isolated (11/13/2022)   Social Connection and Isolation Panel [NHANES]    Frequency of Communication with Friends and Family: More than three times a week    Frequency of Social Gatherings with Friends and Family: More than three times a week    Attends Religious Services: 1 to 4 times per year    Active Member of [employer] or Organizations: No    Attends Music therapist: Never    Marital Status: Never married    Tobacco Counseling Counseling given: Not Answered   Clinical  Intake:  Pre-visit preparation completed: Yes  Pain : No/denies pain     Nutritional Risks: None Diabetes: Yes CBG done?: No Did pt. bring in CBG monitor from home?: No  How often do you need to have someone help you when you read instructions, pamphlets, or other written materials from your doctor or pharmacy?: 1 - Never  Diabetic?yes  Nutrition Risk Assessment:  Has the patient had any N/V/D within the last 2 months?  No  Does the patient have any non-healing wounds?  No  Has the patient had any unintentional weight loss or weight gain?  No   Diabetes:  Is the patient diabetic?  Yes  If diabetic, was a CBG obtained today?  No  Did the patient bring in their glucometer from home?  No  How often do you monitor your CBG's? 3 x week .   Financial Strains and Diabetes Management:  Are you having any financial strains with the device, your supplies or your medication? No .  Does the patient want to be seen by Chronic Care Management for management of their diabetes?  No  Would the patient like to be referred to a Nutritionist or for Diabetic Management?  No   Diabetic Exams:  Diabetic Eye Exam: Overdue for diabetic eye exam. Pt has been advised about the importance in completing this exam. Patient advised to call and schedule an eye exam. Diabetic Foot Exam: Overdue, Pt has been advised about the importance in completing this exam. Pt is scheduled for diabetic foot exam on next office visit .   Interpreter Needed?: No  Information entered by :: Jadene Pierini, LPN   Activities of Daily Living    11/13/2022    8:27 AM  In your present state of health, do you have any difficulty performing the following activities:  Hearing? 0  Vision? 0  Difficulty concentrating or making decisions? 0  Walking or climbing stairs? 0  Dressing or bathing? 0  Doing errands, shopping? 0  Preparing Food and eating ? N  Using the Toilet? N  In the past six months, have you accidently  leaked urine? N  Do you have problems with loss of bowel control? N  Managing your Medications? N  Managing your Finances? N  Housekeeping or managing your Housekeeping? N    Patient Care Team: Dettinger, Fransisca Kaufmann, MD as PCP - General (Family Medicine) Lavera Guise, [facility] (Pharmacist)  Indicate any recent Medical Services you may have received from other than [facility] providers in the past year (date may be approximate).     Assessment:   This is a routine wellness examination for Yoshiyuki.  Hearing/Vision screen Vision Screening - Comments:: Referral 11/12/2021  Dietary issues and exercise activities discussed: Current Exercise Habits: Home exercise routine, Type of exercise: walking, Time (  Minutes): 60, Frequency (Times/Week): 5, Weekly Exercise (Minutes/Week): 300, Intensity: Mild, Exercise limited by: None identified   Goals Addressed             This Visit's Progress    Exercise 3x per week (30 min per time)   On track      Depression Screen    11/13/2022    8:26 AM 09/25/2022   11:18 AM 06/20/2022    8:34 AM 04/19/2020    8:34 AM 08/27/2017    8:43 AM 08/21/2017   10:56 AM 08/18/2017    3:49 PM  PHQ 2/9 Scores  PHQ - 2 Score 0 0 0 0 0 0 0  PHQ- 9 Score 0 0         Fall Risk    11/13/2022    8:24 AM 09/25/2022   11:18 AM 06/20/2022    8:34 AM 04/19/2020    8:34 AM 08/27/2017    8:43 AM  Fall Risk   Falls in the past year? 0 0 0 0 No  Number falls in past yr: 0      Injury with Fall? 0      Risk for fall due to : No Fall Risks      Follow up Falls prevention discussed        Christian:  Any stairs in or around the home? Yes  If so, are there any without handrails? No  Home free of loose throw rugs in walkways, pet beds, electrical cords, etc? Yes  Adequate lighting in your home to reduce risk of falls? Yes   ASSISTIVE DEVICES UTILIZED TO PREVENT FALLS:  Life alert? No  Use of a cane, walker or w/c? No  Grab bars in  the bathroom? No  Shower chair or bench in shower? No  Elevated toilet seat or a handicapped toilet? No        11/13/2022    8:27 AM  6CIT Screen  What Year? 0 points  What month? 0 points  What time? 0 points  Count back from 20 0 points  Months in reverse 0 points  Repeat phrase 0 points  Total Score 0 points    Immunizations Immunization History  Administered Date(s) Administered   Fluad Quad(high Dose 65+) 07/13/2019   Influenza, High Dose Seasonal PF 07/13/2018   Influenza,inj,Quad PF,6+ Mos 06/13/2016, 06/11/2017   Influenza-Unspecified 06/02/2014, 06/21/2015   Pneumococcal Conjugate-13 07/13/2019   Pneumococcal Polysaccharide-23 09/22/2013   Tdap 09/22/2013    TDAP status: Up to date  Flu Vaccine status: Declined, Education has been provided regarding the importance of this vaccine but patient still declined. Advised may receive this vaccine at local pharmacy or Health Dept. Aware to provide a copy of the vaccination record if obtained from local pharmacy or Health Dept. Verbalized acceptance and understanding.  Pneumococcal vaccine status: Up to date  Covid-19 vaccine status: Declined, Education has been provided regarding the importance of this vaccine but patient still declined. Advised may receive this vaccine at local pharmacy or Health Dept.or vaccine clinic. Aware to provide a copy of the vaccination record if obtained from local pharmacy or Health Dept. Verbalized acceptance and understanding.  Qualifies for Shingles Vaccine? Yes   Zostavax completed No   Shingrix Completed?: No.    Education has been provided regarding the importance of this vaccine. Patient has been advised to call insurance company to determine out of pocket expense if they have not yet received this vaccine. Advised  may also receive vaccine at local pharmacy or Health Dept. Verbalized acceptance and understanding.  Screening Tests Health Maintenance  Topic Date Due   OPHTHALMOLOGY EXAM   04/28/2014   INFLUENZA VACCINE  12/01/2022 (Originally 04/02/2022)   Zoster Vaccines- Shingrix (1 of 2) 12/25/2022 (Originally 05/04/1998)   COLONOSCOPY (Pts 45-18yr Insurance coverage will need to be confirmed)  06/21/2023 (Originally 05/04/1993)   HEMOGLOBIN A1C  03/26/2023   DTaP/Tdap/Td (2 - Td or Tdap) 09/23/2023   Diabetic kidney evaluation - eGFR measurement  09/26/2023   Diabetic kidney evaluation - Urine ACR  09/26/2023   FOOT EXAM  09/26/2023   Medicare Annual Wellness (AWV)  11/13/2023   Pneumonia Vaccine 75 Years old  Completed   Hepatitis C Screening  Completed   HPV VACCINES  Aged Out   COVID-19 Vaccine  Discontinued    Health Maintenance  Health Maintenance Due  Topic Date Due   OPHTHALMOLOGY EXAM  04/28/2014    Colorectal cancer screening: Referral to GI placed declined . Pt aware the office will call re: appt.  Lung Cancer Screening: (Low Dose CT Chest recommended if Age 75-80years, 30 pack-year currently smoking OR have quit w/in 15years.) does not qualify.   Lung Cancer Screening Referral: n/a  Additional Screening:  Hepatitis C Screening: does not qualify; Completed 10/07/2013  Vision Screening: Recommended annual ophthalmology exams for early detection of glaucoma and other disorders of the eye. Is the patient up to date with their annual eye exam?  No  Who is the provider or what is the name of the office in which the patient attends annual eye exams? None referral 11/13/2022 If pt is not established with a provider, would they like to be referred to a provider to establish care? No .   Dental Screening: Recommended annual dental exams for proper oral hygiene  Community Resource Referral / Chronic Care Management: CRR required this visit?  No   CCM required this visit?  No      Plan:     I have personally reviewed and noted the following in the patient's chart:   Medical and social history Use of alcohol, tobacco or illicit drugs  Current  medications and supplements including opioid prescriptions. Patient is not currently taking opioid prescriptions. Functional ability and status Nutritional status Physical activity Advanced directives List of other physicians Hospitalizations, surgeries, and ER visits in previous 12 months Vitals Screenings to include cognitive, depression, and falls Referrals and appointments  In addition, I have reviewed and discussed with patient certain preventive protocols, quality metrics, and best practice recommendations. A written personalized care plan for preventive services as well as general preventive health recommendations were provided to patient.     LDaphane Shepherd LPN   [id number]  Nurse Notes: declines Colonoscopy

## 2022-11-13 NOTE — Patient Instructions (Signed)
[name] , Thank you for taking time to come for your Medicare Wellness Visit. I appreciate your ongoing commitment to your health goals. Please review the following plan we discussed and let me know if I can assist you in the future.   These are the goals we discussed:  Goals      Exercise 3x per week (30 min per time)        This is a list of the screening recommended for you and due dates:  Health Maintenance  Topic Date Due   Eye exam for diabetics  04/28/2014   Flu Shot  12/01/2022*   Zoster (Shingles) Vaccine (1 of 2) 12/25/2022*   Colon Cancer Screening  06/21/2023*   Hemoglobin A1C  03/26/2023   DTaP/Tdap/Td vaccine (2 - Td or Tdap) 09/23/2023   Yearly kidney function blood test for diabetes  09/26/2023   Yearly kidney health urinalysis for diabetes  09/26/2023   Complete foot exam   09/26/2023   Medicare Annual Wellness Visit  11/13/2023   Pneumonia Vaccine  Completed   Hepatitis C Screening: USPSTF Recommendation to screen - Ages 18-79 yo.  Completed   HPV Vaccine  Aged Out   COVID-19 Vaccine  Discontinued  *Topic was postponed. The date shown is not the original due date.    Advanced directives: Please bring a copy of your health care power of attorney and living will to the office to be added to your chart at your convenience.   Conditions/risks identified: Aim for 30 minutes of exercise or brisk walking, 6-8 glasses of water, and 5 servings of fruits and vegetables each day.   Next appointment: Follow up in one year for your annual wellness visit.   Preventive Care 40 Years and Older, Male  Preventive care refers to lifestyle choices and visits with your health care provider that can promote health and wellness. What does preventive care include? A yearly physical exam. This is also called an annual well check. Dental exams once or twice a year. Routine eye exams. Ask your health care provider how often you should have your eyes checked. Personal lifestyle  choices, including: Daily care of your teeth and gums. Regular physical activity. Eating a healthy diet. Avoiding tobacco and drug use. Limiting alcohol use. Practicing safe sex. Taking low doses of aspirin every day. Taking vitamin and mineral supplements as recommended by your health care provider. What happens during an annual well check? The services and screenings done by your health care provider during your annual well check will depend on your age, overall health, lifestyle risk factors, and family history of disease. Counseling  Your health care provider may ask you questions about your: Alcohol use. Tobacco use. Drug use. Emotional well-being. Home and relationship well-being. Sexual activity. Eating habits. History of falls. Memory and ability to understand (cognition). Work and work Statistician. Screening  You may have the following tests or measurements: Height, weight, and BMI. Blood pressure. Lipid and cholesterol levels. These may be checked every 5 years, or more frequently if you are over 37 years old. Skin check. Lung cancer screening. You may have this screening every year starting at age 40 if you have a 30-pack-year history of smoking and currently smoke or have quit within the past 15 years. Fecal occult blood test (FOBT) of the stool. You may have this test every year starting at age 29. Flexible sigmoidoscopy or colonoscopy. You may have a sigmoidoscopy every 5 years or a colonoscopy every 10 years starting  at age 46. Prostate cancer screening. Recommendations will vary depending on your family history and other risks. Hepatitis C blood test. Hepatitis B blood test. Sexually transmitted disease (STD) testing. Diabetes screening. This is done by checking your blood sugar (glucose) after you have not eaten for a while (fasting). You may have this done every 1-3 years. Abdominal aortic aneurysm (AAA) screening. You may need this if you are a current or  former smoker. Osteoporosis. You may be screened starting at age 61 if you are at high risk. Talk with your health care provider about your test results, treatment options, and if necessary, the need for more tests. Vaccines  Your health care provider may recommend certain vaccines, such as: Influenza vaccine. This is recommended every year. Tetanus, diphtheria, and acellular pertussis (Tdap, Td) vaccine. You may need a Td booster every 10 years. Zoster vaccine. You may need this after age 68. Pneumococcal 13-valent conjugate (PCV13) vaccine. One dose is recommended after age 61. Pneumococcal polysaccharide (PPSV23) vaccine. One dose is recommended after age 69. Talk to your health care provider about which screenings and vaccines you need and how often you need them. This information is not intended to replace advice given to you by your health care provider. Make sure you discuss any questions you have with your health care provider. Document Released: 09/15/2015 Document Revised: 05/08/2016 Document Reviewed: 06/20/2015 Elsevier Interactive Patient Education  2017 [employer] Prevention in the Home Falls can cause injuries. They can happen to people of all ages. There are many things you can do to make your home safe and to help prevent falls. What can I do on the outside of my home? Regularly fix the edges of walkways and driveways and fix any cracks. Remove anything that might make you trip as you walk through a door, such as a raised step or threshold. Trim any bushes or trees on the path to your home. Use bright outdoor lighting. Clear any walking paths of anything that might make someone trip, such as rocks or tools. Regularly check to see if handrails are loose or broken. Make sure that both sides of any steps have handrails. Any raised decks and porches should have guardrails on the edges. Have any leaves, snow, or ice cleared regularly. Use sand or salt on walking paths  during winter. Clean up any spills in your garage right away. This includes oil or grease spills. What can I do in the bathroom? Use night lights. Install grab bars by the toilet and in the tub and shower. Do not use towel bars as grab bars. Use non-skid mats or decals in the tub or shower. If you need to sit down in the shower, use a plastic, non-slip stool. Keep the floor dry. Clean up any water that spills on the floor as soon as it happens. Remove soap buildup in the tub or shower regularly. Attach bath mats securely with double-sided non-slip rug tape. Do not have throw rugs and other things on the floor that can make you trip. What can I do in the bedroom? Use night lights. Make sure that you have a light by your bed that is easy to reach. Do not use any sheets or blankets that are too big for your bed. They should not hang down onto the floor. Have a firm chair that has side arms. You can use this for support while you get dressed. Do not have throw rugs and other things on the floor that can  make you trip. What can I do in the kitchen? Clean up any spills right away. Avoid walking on wet floors. Keep items that you use a lot in easy-to-reach places. If you need to reach something above you, use a strong step stool that has a grab bar. Keep electrical cords out of the way. Do not use floor polish or wax that makes floors slippery. If you must use wax, use non-skid floor wax. Do not have throw rugs and other things on the floor that can make you trip. What can I do with my stairs? Do not leave any items on the stairs. Make sure that there are handrails on both sides of the stairs and use them. Fix handrails that are broken or loose. Make sure that handrails are as long as the stairways. Check any carpeting to make sure that it is firmly attached to the stairs. Fix any carpet that is loose or worn. Avoid having throw rugs at the top or bottom of the stairs. If you do have throw  rugs, attach them to the floor with carpet tape. Make sure that you have a light switch at the top of the stairs and the bottom of the stairs. If you do not have them, ask someone to add them for you. What else can I do to help prevent falls? Wear shoes that: Do not have high heels. Have rubber bottoms. Are comfortable and fit you well. Are closed at the toe. Do not wear sandals. If you use a stepladder: Make sure that it is fully opened. Do not climb a closed stepladder. Make sure that both sides of the stepladder are locked into place. Ask someone to hold it for you, if possible. Clearly mark and make sure that you can see: Any grab bars or handrails. First and last steps. Where the edge of each step is. Use tools that help you move around (mobility aids) if they are needed. These include: Canes. Walkers. Scooters. Crutches. Turn on the lights when you go into a dark area. Replace any light bulbs as soon as they burn out. Set up your furniture so you have a clear path. Avoid moving your furniture around. If any of your floors are uneven, fix them. If there are any pets around you, be aware of where they are. Review your medicines with your doctor. Some medicines can make you feel dizzy. This can increase your chance of falling. Ask your doctor what other things that you can do to help prevent falls. This information is not intended to replace advice given to you by your health care provider. Make sure you discuss any questions you have with your health care provider. Document Released: 06/15/2009 Document Revised: 01/25/2016 Document Reviewed: 09/23/2014 Elsevier Interactive Patient Education  2017 [employer].

## 2022-11-25 NOTE — Progress Notes (Signed)
I have reviewed and agree with the above AWV documentation Caryl Pina, MD [facility] 11/25/2022, 8:05 AM

## 2022-12-16 ENCOUNTER — Other Ambulatory Visit: Payer: Self-pay | Admitting: Family Medicine

## 2022-12-16 DIAGNOSIS — I10 Essential (primary) hypertension: Secondary | ICD-10-CM

## 2022-12-26 ENCOUNTER — Ambulatory Visit: Payer: Medicare Other | Admitting: Family Medicine

## 2023-02-28 ENCOUNTER — Ambulatory Visit: Payer: Medicare Other | Admitting: Family Medicine

## 2023-03-21 ENCOUNTER — Other Ambulatory Visit: Payer: Self-pay

## 2023-03-21 DIAGNOSIS — I10 Essential (primary) hypertension: Secondary | ICD-10-CM

## 2023-03-21 MED ORDER — LISINOPRIL 10 MG PO TABS
10.0000 mg | ORAL_TABLET | Freq: Every day | ORAL | 0 refills | Status: DC
Start: 2023-03-21 — End: 2023-03-31

## 2023-03-21 MED ORDER — GLIMEPIRIDE 4 MG PO TABS: 4.0000 mg | ORAL_TABLET | Freq: Every day | ORAL | 0 refills | Status: DC

## 2023-03-21 MED ORDER — METFORMIN HCL 1000 MG PO TABS: 1000.0000 mg | ORAL_TABLET | Freq: Two times a day (BID) | ORAL | 0 refills | Status: DC

## 2023-03-21 MED ORDER — LEVOTHYROXINE SODIUM 125 MCG PO TABS: 125.0000 ug | ORAL_TABLET | Freq: Every day | ORAL | 0 refills | Status: DC

## 2023-03-24 ENCOUNTER — Ambulatory Visit: Payer: Medicare Other | Admitting: Family Medicine

## 2023-04-02 ENCOUNTER — Ambulatory Visit (INDEPENDENT_AMBULATORY_CARE_PROVIDER_SITE_OTHER): Payer: Medicare Other | Admitting: Family Medicine

## 2023-04-02 ENCOUNTER — Encounter: Payer: Self-pay | Admitting: Family Medicine

## 2023-04-02 ENCOUNTER — Other Ambulatory Visit: Payer: Self-pay | Admitting: Family Medicine

## 2023-04-02 VITALS — BP 137/69 | HR 84 | Ht 70.0 in | Wt 197.0 lb

## 2023-04-02 DIAGNOSIS — E1159 Type 2 diabetes mellitus with other circulatory complications: Secondary | ICD-10-CM

## 2023-04-02 DIAGNOSIS — E119 Type 2 diabetes mellitus without complications: Secondary | ICD-10-CM | POA: Insufficient documentation

## 2023-04-02 DIAGNOSIS — N1831 Chronic kidney disease, stage 3a: Secondary | ICD-10-CM | POA: Diagnosis not present

## 2023-04-02 DIAGNOSIS — Z7984 Long term (current) use of oral hypoglycemic drugs: Secondary | ICD-10-CM

## 2023-04-02 DIAGNOSIS — E1169 Type 2 diabetes mellitus with other specified complication: Secondary | ICD-10-CM | POA: Diagnosis not present

## 2023-04-02 DIAGNOSIS — I10 Essential (primary) hypertension: Secondary | ICD-10-CM | POA: Diagnosis not present

## 2023-04-02 DIAGNOSIS — E038 Other specified hypothyroidism: Secondary | ICD-10-CM

## 2023-04-02 DIAGNOSIS — I152 Hypertension secondary to endocrine disorders: Secondary | ICD-10-CM

## 2023-04-02 LAB — BAYER DCA HB A1C WAIVED: HB A1C (BAYER DCA - WAIVED): 7.1 % — ABNORMAL HIGH (ref 4.8–5.6)

## 2023-04-02 MED ORDER — LISINOPRIL 10 MG PO TABS
10.0000 mg | ORAL_TABLET | Freq: Every day | ORAL | 3 refills | Status: DC
Start: 2023-04-02 — End: 2024-01-28

## 2023-04-02 MED ORDER — GLIMEPIRIDE 4 MG PO TABS: 4.0000 mg | ORAL_TABLET | Freq: Every day | ORAL | 3 refills | Status: DC

## 2023-04-02 MED ORDER — LEVOTHYROXINE SODIUM 125 MCG PO TABS: 125.0000 ug | ORAL_TABLET | Freq: Every day | ORAL | 3 refills | Status: DC

## 2023-04-02 MED ORDER — METFORMIN HCL 1000 MG PO TABS: 1000.0000 mg | ORAL_TABLET | Freq: Two times a day (BID) | ORAL | 3 refills | Status: DC

## 2023-04-02 NOTE — Progress Notes (Signed)
BP 137/69   Pulse 84   Ht 5\' 10"  (1.778 m)   Wt 197 lb (89.4 kg)   SpO2 97%   BMI 28.27 kg/m    Subjective:   Patient ID: Patrick [name], male    DOB: April 28, 1946, 77 y.o.   MRN: [id number]  HPI: Patrick [name] is a 77 y.o. male presenting on 04/02/2023 for Medical Management of Chronic Issues and Diabetes   HPI Type 2 diabetes mellitus Patient comes in today for recheck of his diabetes. Patient has been currently taking metformin and glimepiride. Patient is currently on an ACE inhibitor/ARB. Patient has not seen an ophthalmologist this year. Patient denies any new issues with their feet. The symptom started onset as an adult hypertension and hypothyroidism and CKD 3 ARE RELATED TO DM   Hypertension and CKD 3 Patient is currently on lisinopril, and their blood pressure today is 137/69. Patient denies any lightheadedness or dizziness. Patient denies headaches, blurred vision, chest pains, shortness of breath, or weakness. Denies any side effects from medication and is content with current medication.   Hypothyroidism recheck Patient is coming in for thyroid recheck today as well. They deny any issues with hair changes or heat or cold problems or diarrhea or constipation. They deny any chest pain or palpitations. They are currently on levothyroxine 125 micrograms   Relevant past medical, surgical, family and social history reviewed and updated as indicated. Interim medical history since our last visit reviewed. Allergies and medications reviewed and updated.  Review of Systems  Constitutional:  Negative for chills and fever.  Eyes:  Negative for visual disturbance.  Respiratory:  Negative for shortness of breath and wheezing.   Cardiovascular:  Negative for chest pain and leg swelling.  Musculoskeletal:  Negative for back pain and gait problem.  Skin:  Negative for rash.  Neurological:  Negative for dizziness, weakness and numbness.  All other systems reviewed and are  negative.   Per HPI unless specifically indicated above   Allergies as of 04/02/2023       Reactions   Codeine         Medication List        Accurate as of April 02, 2023  2:09 PM. If you have any questions, ask your nurse or doctor.          glimepiride 4 MG tablet Commonly known as: AMARYL Take 1 tablet (4 mg total) by mouth daily before breakfast.   levothyroxine 125 MCG tablet Commonly known as: SYNTHROID Take 1 tablet (125 mcg total) by mouth daily.   lisinopril 10 MG tablet Commonly known as: ZESTRIL Take 1 tablet (10 mg total) by mouth daily.   metFORMIN 1000 MG tablet Commonly known as: GLUCOPHAGE Take 1 tablet (1,000 mg total) by mouth 2 (two) times daily with a meal.         Objective:   BP 137/69   Pulse 84   Ht 5\' 10"  (1.778 m)   Wt 197 lb (89.4 kg)   SpO2 97%   BMI 28.27 kg/m   Wt Readings from Last 3 Encounters:  04/02/23 197 lb (89.4 kg)  11/13/22 185 lb (83.9 kg)  09/25/22 188 lb (85.3 kg)    Physical Exam Vitals and nursing note reviewed.  Constitutional:      General: He is not in acute distress.    Appearance: He is well-developed. He is not diaphoretic.  Eyes:     General: No scleral icterus.    Conjunctiva/sclera: Conjunctivae normal.  Neck:     Thyroid: No thyromegaly.  Cardiovascular:     Rate and Rhythm: Normal rate and regular rhythm.     Heart sounds: Normal heart sounds. No murmur heard. Pulmonary:     Effort: Pulmonary effort is normal. No respiratory distress.     Breath sounds: Normal breath sounds. No wheezing.  Musculoskeletal:        General: No swelling. Normal range of motion.     Cervical back: Neck supple.  Lymphadenopathy:     Cervical: No cervical adenopathy.  Skin:    General: Skin is warm and dry.     Findings: No rash.  Neurological:     Mental Status: He is alert and oriented to person, place, and time.     Coordination: Coordination normal.  Psychiatric:        Behavior: Behavior normal.        Assessment & Plan:   Problem List Items Addressed This Visit       Cardiovascular and Mediastinum   Hypertension associated with diabetes (HCC)   Relevant Medications   glimepiride (AMARYL) 4 MG tablet   lisinopril (ZESTRIL) 10 MG tablet   metFORMIN (GLUCOPHAGE) 1000 MG tablet     Endocrine   Type 2 diabetes mellitus with other specified complication (HCC)   Relevant Medications   glimepiride (AMARYL) 4 MG tablet   lisinopril (ZESTRIL) 10 MG tablet   metFORMIN (GLUCOPHAGE) 1000 MG tablet   Other Relevant Orders   CBC with Differential/Platelet   CMP14+EGFR   Lipid panel   Bayer DCA Hb A1c Waived   TSH   Hypothyroidism   Relevant Medications   levothyroxine (SYNTHROID) 125 MCG tablet   Diabetes mellitus treated with oral medication (HCC)   Relevant Medications   glimepiride (AMARYL) 4 MG tablet   lisinopril (ZESTRIL) 10 MG tablet   metFORMIN (GLUCOPHAGE) 1000 MG tablet     Genitourinary   CKD (chronic kidney disease), stage III (HCC)   Relevant Orders   CBC with Differential/Platelet   CMP14+EGFR   Lipid panel   Bayer DCA Hb A1c Waived   TSH   Other Visit Diagnoses     Essential hypertension, benign    -  Primary   Relevant Medications   lisinopril (ZESTRIL) 10 MG tablet   Other Relevant Orders   CBC with Differential/Platelet   CMP14+EGFR   Lipid panel   Bayer DCA Hb A1c Waived   TSH       A1c is 7.1 which is up slightly, focus on diet, he says he eats a lot of potatoes and he will reduce those and will follow-up from there.  Spent care time discussing chronic medical issues including diabetes and diet and exercise. Follow up plan: Return in about 6 months (around 10/03/2023), or if symptoms worsen or fail to improve, for Diabetes and hypertension and hypothyroidism.  Counseling provided for all of the vaccine components Orders Placed This Encounter  Procedures   CBC with Differential/Platelet   CMP14+EGFR   Lipid panel   Bayer DCA Hb A1c  Waived   TSH    Arville Care, MD [facility] 04/02/2023, 2:09 PM

## 2023-04-03 LAB — CBC WITH DIFFERENTIAL/PLATELET
Basophils Absolute: 0.1 10*3/uL (ref 0.0–0.2)
Basos: 1 %
EOS (ABSOLUTE): 0.2 10*3/uL (ref 0.0–0.4)
Eos: 2 %
Hematocrit: 31.7 % — ABNORMAL LOW (ref 37.5–51.0)
Hemoglobin: 10.4 g/dL — ABNORMAL LOW (ref 13.0–17.7)
Immature Grans (Abs): 0 10*3/uL (ref 0.0–0.1)
Immature Granulocytes: 0 %
Lymphocytes Absolute: 1.2 10*3/uL (ref 0.7–3.1)
Lymphs: 17 %
MCH: 30.7 pg (ref 26.6–33.0)
MCHC: 32.8 g/dL (ref 31.5–35.7)
MCV: 94 fL (ref 79–97)
Monocytes Absolute: 0.6 10*3/uL (ref 0.1–0.9)
Monocytes: 8 %
Neutrophils Absolute: 5 10*3/uL (ref 1.4–7.0)
Neutrophils: 72 %
Platelets: 156 10*3/uL (ref 150–450)
RBC: 3.39 x10E6/uL — ABNORMAL LOW (ref 4.14–5.80)
RDW: 12.9 % (ref 11.6–15.4)
WBC: 7 10*3/uL (ref 3.4–10.8)

## 2023-04-03 LAB — CMP14+EGFR
ALT: 14 IU/L (ref 0–44)
AST: 18 IU/L (ref 0–40)
Albumin: 4 g/dL (ref 3.8–4.8)
Alkaline Phosphatase: 51 IU/L (ref 44–121)
BUN/Creatinine Ratio: 18 (ref 10–24)
BUN: 29 mg/dL — ABNORMAL HIGH (ref 8–27)
Bilirubin Total: 0.2 mg/dL (ref 0.0–1.2)
CO2: 20 mmol/L (ref 20–29)
Calcium: 9.4 mg/dL (ref 8.6–10.2)
Chloride: 105 mmol/L (ref 96–106)
Creatinine, Ser: 1.59 mg/dL — ABNORMAL HIGH (ref 0.76–1.27)
Globulin, Total: 2.7 g/dL (ref 1.5–4.5)
Glucose: 171 mg/dL — ABNORMAL HIGH (ref 70–99)
Potassium: 5.4 mmol/L — ABNORMAL HIGH (ref 3.5–5.2)
Sodium: 140 mmol/L (ref 134–144)
Total Protein: 6.7 g/dL (ref 6.0–8.5)
eGFR: 45 mL/min/{1.73_m2} — ABNORMAL LOW (ref >59)

## 2023-04-03 LAB — LIPID PANEL
Chol/HDL Ratio: 3 ratio (ref 0.0–5.0)
Cholesterol, Total: 115 mg/dL (ref 100–199)
HDL: 38 mg/dL — ABNORMAL LOW (ref >39)
LDL Chol Calc (NIH): 52 mg/dL (ref 0–99)
Triglycerides: 145 mg/dL (ref 0–149)
VLDL Cholesterol Cal: 25 mg/dL (ref 5–40)

## 2023-04-03 LAB — TSH: TSH: 2.37 u[IU]/mL (ref 0.450–4.500)

## 2023-04-07 MED ORDER — IRON (FERROUS SULFATE) 325 (65 FE) MG PO TABS: 325.0000 mg | ORAL_TABLET | Freq: Every day | ORAL | 5 refills | Status: DC

## 2023-04-07 NOTE — Addendum Note (Signed)
Addended by: Lorelee Cover C on: 04/07/2023 10:23 AM   Modules accepted: Orders

## 2023-05-14 ENCOUNTER — Telehealth: Payer: Self-pay | Admitting: Family Medicine

## 2023-10-03 ENCOUNTER — Ambulatory Visit: Payer: Medicare Other | Admitting: Family Medicine

## 2023-10-03 ENCOUNTER — Ambulatory Visit (INDEPENDENT_AMBULATORY_CARE_PROVIDER_SITE_OTHER): Payer: Medicare Other | Admitting: Family Medicine

## 2023-10-03 ENCOUNTER — Encounter: Payer: Self-pay | Admitting: Family Medicine

## 2023-10-03 VITALS — BP 161/70 | HR 77 | Ht 70.0 in | Wt 194.0 lb

## 2023-10-03 DIAGNOSIS — E1169 Type 2 diabetes mellitus with other specified complication: Secondary | ICD-10-CM

## 2023-10-03 DIAGNOSIS — Z7984 Long term (current) use of oral hypoglycemic drugs: Secondary | ICD-10-CM

## 2023-10-03 DIAGNOSIS — E038 Other specified hypothyroidism: Secondary | ICD-10-CM | POA: Diagnosis not present

## 2023-10-03 DIAGNOSIS — B353 Tinea pedis: Secondary | ICD-10-CM

## 2023-10-03 DIAGNOSIS — E119 Type 2 diabetes mellitus without complications: Secondary | ICD-10-CM

## 2023-10-03 DIAGNOSIS — E1159 Type 2 diabetes mellitus with other circulatory complications: Secondary | ICD-10-CM

## 2023-10-03 DIAGNOSIS — I152 Hypertension secondary to endocrine disorders: Secondary | ICD-10-CM

## 2023-10-03 DIAGNOSIS — N1831 Chronic kidney disease, stage 3a: Secondary | ICD-10-CM

## 2023-10-03 DIAGNOSIS — E1140 Type 2 diabetes mellitus with diabetic neuropathy, unspecified: Secondary | ICD-10-CM

## 2023-10-03 LAB — LIPID PANEL

## 2023-10-03 LAB — BAYER DCA HB A1C WAIVED: HB A1C (BAYER DCA - WAIVED): 6.2 % — ABNORMAL HIGH (ref 4.8–5.6)

## 2023-10-03 NOTE — Progress Notes (Signed)
BP (!) 161/70   Pulse 77   Ht 5\' 10"  (1.778 m)   Wt 194 lb (88 kg)   SpO2 97%   BMI 27.84 kg/m    Subjective:   Patient ID: Patrick [name], male    DOB: [birth year], 78 y.o.   MRN: [id number]  HPI: Patrick [name] is a 78 y.o. male presenting on 10/03/2023 for Medical Management of Chronic Issues, Diabetes, and Hypertension   HPI Type 2 diabetes mellitus Patient comes in today for recheck of his diabetes. Patient has been currently taking glimepiride and metformin. Patient is currently on an ACE inhibitor/ARB. Patient has not seen an ophthalmologist this year.  Fungus and neuropathy. The symptom started onset as an adult neuropathy and hypertension and hypothyroidism ARE RELATED TO DM   Hypertension Patient is currently on lisinopril, and their blood pressure today is 161/70, he says it runs better at home.. Patient denies any lightheadedness or dizziness. Patient denies headaches, blurred vision, chest pains, shortness of breath, or weakness. Denies any side effects from medication and is content with current medication.   Hypothyroidism recheck Patient is coming in for thyroid recheck today as well. They deny any issues with hair changes or heat or cold problems or diarrhea or constipation. They deny any chest pain or palpitations. 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 Patient does get a lot more fungus and starting to get some cracking between his toes on the left foot because of his deformity due to a severe bunion on that side and the toes overlap each other.  He does have an athlete's foot cream that he is using.  Patient had slightly elevated potassium on the last blood check and that did not come back in and recheck it since then.  We will recheck that today  Relevant past medical, surgical, family and social history reviewed and updated as indicated. Interim medical history since our last visit reviewed. Allergies and  medications reviewed and updated.  Review of Systems  Constitutional:  Negative for chills and fever.  Eyes:  Negative for visual disturbance.  Respiratory:  Negative for shortness of breath and wheezing.   Cardiovascular:  Negative for chest pain and leg swelling.  Musculoskeletal:  Negative for back pain and gait problem.  Skin:  Positive for color change and wound. Negative for rash.  All other systems reviewed and are negative.   Per HPI unless specifically indicated above   Allergies as of 10/03/2023       Reactions   Codeine         Medication List        Accurate as of October 03, 2023 10:09 AM. If you have any questions, ask your nurse or doctor.          glimepiride 4 MG tablet Commonly known as: AMARYL Take 1 tablet (4 mg total) by mouth daily before breakfast.   Iron (Ferrous Sulfate) 325 (65 Fe) MG Tabs Take 325 mg by mouth daily.   levothyroxine 125 MCG tablet Commonly known as: SYNTHROID Take 1 tablet (125 mcg total) by mouth daily.   lisinopril 10 MG tablet Commonly known as: ZESTRIL Take 1 tablet (10 mg total) by mouth daily.   metFORMIN 1000 MG tablet Commonly known as: GLUCOPHAGE Take 1 tablet (1,000 mg total) by mouth 2 (two) times daily with a meal.         Objective:   BP (!) 161/70   Pulse 77  Ht 5\' 10"  (1.778 m)   Wt 194 lb (88 kg)   SpO2 97%   BMI 27.84 kg/m   Wt Readings from Last 3 Encounters:  10/03/23 194 lb (88 kg)  04/02/23 197 lb (89.4 kg)  11/13/22 185 lb (83.9 kg)    Physical Exam Vitals and nursing note reviewed.  Constitutional:      General: He is not in acute distress.    Appearance: He is well-developed. He is not diaphoretic.  Eyes:     General: No scleral icterus.    Conjunctiva/sclera: Conjunctivae normal.  Neck:     Thyroid: No thyromegaly.  Cardiovascular:     Rate and Rhythm: Normal rate and regular rhythm.     Heart sounds: Normal heart sounds. No murmur heard. Pulmonary:     Effort:  Pulmonary effort is normal. No respiratory distress.     Breath sounds: Normal breath sounds. No wheezing.  Musculoskeletal:        General: Normal range of motion.     Cervical back: Neck supple.  Lymphadenopathy:     Cervical: No cervical adenopathy.  Skin:    General: Skin is warm and dry.     Findings: No rash.  Neurological:     Mental Status: He is alert and oriented to person, place, and time.     Coordination: Coordination normal.  Psychiatric:        Behavior: Behavior normal.    Diabetic Foot Exam - Simple   Simple Foot Form Diabetic Foot exam was performed with the following findings: Yes 10/03/2023 10:08 AM  Visual Inspection See comments: Yes Sensation Testing See comments: Yes Pulse Check Posterior Tibialis and Dorsalis pulse intact bilaterally: Yes Comments Patient has a very significant bunion on his left foot that causes his toes to overlap and he started to develop some moisture and fungus between the second and third and third and fourth toes and has some early cracking.  Also has diminished sensation in his toes on both feet       Assessment & Plan:   Problem List Items Addressed This Visit       Cardiovascular and Mediastinum   Hypertension associated with diabetes (HCC)   Relevant Orders   CBC with Differential/Platelet   CMP14+EGFR   Lipid panel   Bayer DCA Hb A1c Waived   TSH     Endocrine   Type 2 diabetes mellitus with other specified complication (HCC) - Primary   Relevant Orders   CBC with Differential/Platelet   CMP14+EGFR   Lipid panel   Bayer DCA Hb A1c Waived   TSH   Microalbumin / creatinine urine ratio   Hypothyroidism   Relevant Orders   CBC with Differential/Platelet   CMP14+EGFR   Lipid panel   Bayer DCA Hb A1c Waived   TSH   Diabetes mellitus treated with oral medication (HCC)     Genitourinary   CKD (chronic kidney disease), stage III (HCC)   Relevant Orders   CBC with Differential/Platelet   CMP14+EGFR   Lipid  panel   Bayer DCA Hb A1c Waived   TSH   Microalbumin / creatinine urine ratio   Other Visit Diagnoses       Type 2 diabetes mellitus with diabetic neuropathy, without long-term current use of insulin (HCC)         Tinea pedis of left foot           Has an athlete's foot cream already that he is using, recommended that  he use gauze 2 x 2 swabs and fold them and put between his toes to help keep the moisture away and help it heal.  Recommended that he come back in 2 weeks unless he can get into his podiatrist before that.  A1c looks good at 6.2.  Blood pressure elevated today and he will monitor at home. Follow up plan: Return in about 3 months (around 12/31/2023), or if symptoms worsen or fail to improve, for Diabetes hypertension thyroid.  Counseling provided for all of the vaccine components Orders Placed This Encounter  Procedures   CBC with Differential/Platelet   CMP14+EGFR   Lipid panel   Bayer DCA Hb A1c Waived   TSH   Microalbumin / creatinine urine ratio    Arville Care, MD [place] [facility] 10/03/2023, 10:09 AM

## 2023-10-04 LAB — CBC WITH DIFFERENTIAL/PLATELET
Basophils Absolute: 0.1 10*3/uL (ref 0.0–0.2)
Basos: 1 %
EOS (ABSOLUTE): 0.2 10*3/uL (ref 0.0–0.4)
Eos: 3 %
Hematocrit: 36 % — ABNORMAL LOW (ref 37.5–51.0)
Hemoglobin: 11.5 g/dL — ABNORMAL LOW (ref 13.0–17.7)
Immature Grans (Abs): 0 10*3/uL (ref 0.0–0.1)
Immature Granulocytes: 0 %
Lymphocytes Absolute: 1.9 10*3/uL (ref 0.7–3.1)
Lymphs: 24 %
MCH: 30.9 pg (ref 26.6–33.0)
MCHC: 31.9 g/dL (ref 31.5–35.7)
MCV: 97 fL (ref 79–97)
Monocytes Absolute: 0.7 10*3/uL (ref 0.1–0.9)
Monocytes: 8 %
Neutrophils Absolute: 5.2 10*3/uL (ref 1.4–7.0)
Neutrophils: 64 %
Platelets: 127 10*3/uL — ABNORMAL LOW (ref 150–450)
RBC: 3.72 x10E6/uL — ABNORMAL LOW (ref 4.14–5.80)
RDW: 13.4 % (ref 11.6–15.4)
WBC: 8.1 10*3/uL (ref 3.4–10.8)

## 2023-10-04 LAB — MICROALBUMIN / CREATININE URINE RATIO
Creatinine, Urine: 213.6 mg/dL
Microalb/Creat Ratio: 357 mg/g{creat} — ABNORMAL HIGH (ref 0–29)
Microalbumin, Urine: 763.2 ug/mL

## 2023-10-04 LAB — LIPID PANEL
Cholesterol, Total: 146 mg/dL (ref 100–199)
HDL: 50 mg/dL (ref >39)
LDL CALC COMMENT:: 2.9 ratio (ref 0.0–5.0)
LDL Chol Calc (NIH): 71 mg/dL (ref 0–99)
Triglycerides: 148 mg/dL (ref 0–149)
VLDL Cholesterol Cal: 25 mg/dL (ref 5–40)

## 2023-10-04 LAB — CMP14+EGFR
ALT: 25 [IU]/L (ref 0–44)
AST: 25 [IU]/L (ref 0–40)
Albumin: 4 g/dL (ref 3.8–4.8)
Alkaline Phosphatase: 64 [IU]/L (ref 44–121)
BUN/Creatinine Ratio: 18 (ref 10–24)
BUN: 34 mg/dL — ABNORMAL HIGH (ref 8–27)
Bilirubin Total: 0.4 mg/dL (ref 0.0–1.2)
CO2: 22 mmol/L (ref 20–29)
Calcium: 9.5 mg/dL (ref 8.6–10.2)
Chloride: 105 mmol/L (ref 96–106)
Creatinine, Ser: 1.88 mg/dL — ABNORMAL HIGH (ref 0.76–1.27)
Globulin, Total: 2.7 g/dL (ref 1.5–4.5)
Glucose: 140 mg/dL — ABNORMAL HIGH (ref 70–99)
Potassium: 5.4 mmol/L — ABNORMAL HIGH (ref 3.5–5.2)
Sodium: 141 mmol/L (ref 134–144)
Total Protein: 6.7 g/dL (ref 6.0–8.5)
eGFR: 37 mL/min/{1.73_m2} — ABNORMAL LOW (ref >59)

## 2023-10-04 LAB — TSH: TSH: 10.1 u[IU]/mL — ABNORMAL HIGH (ref 0.450–4.500)

## 2023-10-15 ENCOUNTER — Encounter: Payer: Self-pay | Admitting: Family Medicine

## 2023-10-16 ENCOUNTER — Other Ambulatory Visit: Payer: Self-pay | Admitting: Family Medicine

## 2023-10-16 DIAGNOSIS — N1831 Chronic kidney disease, stage 3a: Secondary | ICD-10-CM

## 2023-10-17 ENCOUNTER — Encounter: Payer: Self-pay | Admitting: Family Medicine

## 2023-10-17 ENCOUNTER — Ambulatory Visit (INDEPENDENT_AMBULATORY_CARE_PROVIDER_SITE_OTHER): Payer: Medicare Other | Admitting: Family Medicine

## 2023-10-17 VITALS — BP 164/67 | HR 92 | Ht 70.0 in | Wt 194.0 lb

## 2023-10-17 DIAGNOSIS — B353 Tinea pedis: Secondary | ICD-10-CM | POA: Diagnosis not present

## 2023-10-17 NOTE — Progress Notes (Signed)
BP (!) 164/67   Pulse 92   Ht 5\' 10"  (1.778 m)   Wt 194 lb (88 kg)   SpO2 97%   BMI 27.84 kg/m    Subjective:   Patient ID: Patrick [name], male    DOB: [birth year], 78 y.o.   MRN: [id number]  HPI: Patrick [name] is a 78 y.o. male presenting on 10/17/2023 for Foot Problem (Improving per pt)   HPI Recheck tinea pedis Patient is coming in today for recheck for tinea pedis.  He has been continuing to wedge between his toes and continue to use athlete's foot cream and it seems to get a lot better.  He feels like is doing very well and healing well and he has been able to keep it more dry.  Relevant past medical, surgical, family and social history reviewed and updated as indicated. Interim medical history since our last visit reviewed. Allergies and medications reviewed and updated.  Review of Systems  Constitutional:  Negative for chills and fever.  Respiratory:  Negative for shortness of breath and wheezing.   Cardiovascular:  Negative for chest pain and leg swelling.  Skin:  Positive for rash. Negative for color change and wound.  All other systems reviewed and are negative.   Per HPI unless specifically indicated above   Allergies as of 10/17/2023       Reactions   Codeine         Medication List        Accurate as of October 17, 2023  8:27 AM. If you have any questions, ask your nurse or doctor.          glimepiride 4 MG tablet Commonly known as: AMARYL Take 1 tablet (4 mg total) by mouth daily before breakfast.   Iron (Ferrous Sulfate) 325 (65 Fe) MG Tabs Take 325 mg by mouth daily.   levothyroxine 125 MCG tablet Commonly known as: SYNTHROID Take 1 tablet (125 mcg total) by mouth daily.   lisinopril 10 MG tablet Commonly known as: ZESTRIL Take 1 tablet (10 mg total) by mouth daily.   metFORMIN 1000 MG tablet Commonly known as: GLUCOPHAGE Take 1 tablet (1,000 mg total) by mouth 2 (two) times daily with a meal.         Objective:   BP  (!) 164/67   Pulse 92   Ht 5\' 10"  (1.778 m)   Wt 194 lb (88 kg)   SpO2 97%   BMI 27.84 kg/m   Wt Readings from Last 3 Encounters:  10/17/23 194 lb (88 kg)  10/03/23 194 lb (88 kg)  04/02/23 197 lb (89.4 kg)    Physical Exam Vitals and nursing note reviewed.  Constitutional:      Appearance: Normal appearance.  Musculoskeletal:     Left foot: Bunion present.  Feet:     Left foot:     Skin integrity: No ulcer, skin breakdown, erythema, warmth or callus.     Comments: Looks a lot better.  Almost completely healed. Neurological:     Mental Status: He is alert.       Assessment & Plan:   Problem List Items Addressed This Visit   None Visit Diagnoses       Tinea pedis of left foot    -  Primary       Athlete's foot loss a lot better.  We cleared him but he can continue to do the wedge in between toes and prevent this from coming back in the future. Follow  up plan: Return if symptoms worsen or fail to improve.  Counseling provided for all of the vaccine components No orders of the defined types were placed in this encounter.   Arville Care, MD [facility] 10/17/2023, 8:27 AM

## 2023-10-30 ENCOUNTER — Other Ambulatory Visit: Payer: Medicare Other

## 2023-10-30 DIAGNOSIS — N1831 Chronic kidney disease, stage 3a: Secondary | ICD-10-CM

## 2023-10-31 ENCOUNTER — Encounter: Payer: Self-pay | Admitting: Family Medicine

## 2023-10-31 LAB — CMP14+EGFR
ALT: 23 [IU]/L (ref 0–44)
AST: 23 [IU]/L (ref 0–40)
Albumin: 3.8 g/dL (ref 3.8–4.8)
Alkaline Phosphatase: 61 [IU]/L (ref 44–121)
BUN/Creatinine Ratio: 14 (ref 10–24)
BUN: 25 mg/dL (ref 8–27)
Bilirubin Total: 0.4 mg/dL (ref 0.0–1.2)
CO2: 19 mmol/L — ABNORMAL LOW (ref 20–29)
Calcium: 9.2 mg/dL (ref 8.6–10.2)
Chloride: 106 mmol/L (ref 96–106)
Creatinine, Ser: 1.74 mg/dL — ABNORMAL HIGH (ref 0.76–1.27)
Globulin, Total: 2.7 g/dL (ref 1.5–4.5)
Glucose: 105 mg/dL — ABNORMAL HIGH (ref 70–99)
Potassium: 4.6 mmol/L (ref 3.5–5.2)
Sodium: 142 mmol/L (ref 134–144)
Total Protein: 6.5 g/dL (ref 6.0–8.5)
eGFR: 40 mL/min/{1.73_m2} — ABNORMAL LOW (ref >59)

## 2023-11-12 ENCOUNTER — Other Ambulatory Visit: Payer: Self-pay | Admitting: Family Medicine

## 2024-01-01 ENCOUNTER — Ambulatory Visit: Payer: Medicare Other | Admitting: Family Medicine

## 2024-01-28 ENCOUNTER — Ambulatory Visit (INDEPENDENT_AMBULATORY_CARE_PROVIDER_SITE_OTHER): Admitting: Family Medicine

## 2024-01-28 ENCOUNTER — Encounter: Payer: Self-pay | Admitting: Family Medicine

## 2024-01-28 VITALS — BP 161/77 | HR 75 | Ht 70.0 in | Wt 193.0 lb

## 2024-01-28 DIAGNOSIS — R351 Nocturia: Secondary | ICD-10-CM

## 2024-01-28 DIAGNOSIS — E1159 Type 2 diabetes mellitus with other circulatory complications: Secondary | ICD-10-CM

## 2024-01-28 DIAGNOSIS — I152 Hypertension secondary to endocrine disorders: Secondary | ICD-10-CM

## 2024-01-28 DIAGNOSIS — E1169 Type 2 diabetes mellitus with other specified complication: Secondary | ICD-10-CM | POA: Diagnosis not present

## 2024-01-28 DIAGNOSIS — N1831 Chronic kidney disease, stage 3a: Secondary | ICD-10-CM | POA: Diagnosis not present

## 2024-01-28 DIAGNOSIS — Z125 Encounter for screening for malignant neoplasm of prostate: Secondary | ICD-10-CM

## 2024-01-28 DIAGNOSIS — E038 Other specified hypothyroidism: Secondary | ICD-10-CM | POA: Diagnosis not present

## 2024-01-28 DIAGNOSIS — Z7984 Long term (current) use of oral hypoglycemic drugs: Secondary | ICD-10-CM

## 2024-01-28 DIAGNOSIS — I10 Essential (primary) hypertension: Secondary | ICD-10-CM

## 2024-01-28 LAB — BAYER DCA HB A1C WAIVED: HB A1C (BAYER DCA - WAIVED): 6.6 % — ABNORMAL HIGH (ref 4.8–5.6)

## 2024-01-28 LAB — LIPID PANEL

## 2024-01-28 MED ORDER — LEVOTHYROXINE SODIUM 125 MCG PO TABS
125.0000 ug | ORAL_TABLET | Freq: Every day | ORAL | 3 refills | Status: DC
Start: 2024-01-28 — End: 2024-02-05

## 2024-01-28 MED ORDER — METFORMIN HCL 1000 MG PO TABS
1000.0000 mg | ORAL_TABLET | Freq: Two times a day (BID) | ORAL | 3 refills | Status: DC
Start: 2024-01-28 — End: 2024-04-03

## 2024-01-28 MED ORDER — LISINOPRIL 10 MG PO TABS: 10.0000 mg | ORAL_TABLET | Freq: Every day | ORAL | 3 refills | Status: DC

## 2024-01-28 MED ORDER — GLIMEPIRIDE 4 MG PO TABS: 4.0000 mg | ORAL_TABLET | Freq: Every day | ORAL | 3 refills | Status: DC

## 2024-01-28 NOTE — Progress Notes (Signed)
 BP (!) 161/77   Pulse 75   Ht 5\' 10"  (1.778 m)   Wt 193 lb (87.5 kg)   SpO2 97%   BMI 27.69 kg/m    Subjective:   Patient ID: Patrick [name], male    DOB: September 14, 1945, 78 y.o.   MRN: [id number]  HPI: Patrick [name] is a 78 y.o. male presenting on 01/28/2024 for Medical Management of Chronic Issues, Diabetes, and Hypothyroidism   HPI Type 2 diabetes mellitus Patient comes in today for recheck of his diabetes. Patient has been currently taking glimepiride  and metformin . Patient is currently on an ACE inhibitor/ARB. Patient has seen an ophthalmologist this year. Patient denies any new issues with their feet. The symptom started onset as an adult hypothyroidism and hypertension and CKD 3 ARE RELATED TO DM   Hypertension Patient is currently on lisinopril , and their blood pressure today is 161/77 and recheck 143/76. Patient denies any lightheadedness or dizziness. Patient denies headaches, blurred vision, chest pains, shortness of breath, or weakness. Denies any side effects from medication and is content with current medication.   Hypothyroidism recheck Patient is coming in for thyroid  recheck today as well. They deny any issues with hair changes or heat or cold problems or diarrhea or constipation. They deny any chest pain or palpitations. They are currently on levothyroxine  125 micrograms   Relevant past medical, surgical, family and social history reviewed and updated as indicated. Interim medical history since our last visit reviewed. Allergies and medications reviewed and updated.  Review of Systems  Constitutional:  Negative for chills and fever.  Eyes:  Negative for visual disturbance.  Respiratory:  Negative for shortness of breath and wheezing.   Cardiovascular:  Negative for chest pain and leg swelling.  Musculoskeletal:  Negative for back pain and gait problem.  Skin:  Negative for rash.  Neurological:  Negative for dizziness and light-headedness.  All other systems  reviewed and are negative.   Per HPI unless specifically indicated above   Allergies as of 01/28/2024       Reactions   Codeine         Medication List        Accurate as of Jan 28, 2024  2:01 PM. If you have any questions, ask your nurse or doctor.          glimepiride  4 MG tablet Commonly known as: AMARYL  Take 1 tablet (4 mg total) by mouth daily before breakfast.   levothyroxine  125 MCG tablet Commonly known as: SYNTHROID  Take 1 tablet (125 mcg total) by mouth daily.   lisinopril  10 MG tablet Commonly known as: ZESTRIL  Take 1 tablet (10 mg total) by mouth daily.   metFORMIN  1000 MG tablet Commonly known as: GLUCOPHAGE  Take 1 tablet (1,000 mg total) by mouth 2 (two) times daily with a meal.   SV Iron  325 (65 Fe) MG tablet Generic drug: ferrous sulfate  Take 1 tablet by mouth once daily         Objective:   BP (!) 161/77   Pulse 75   Ht 5\' 10"  (1.778 m)   Wt 193 lb (87.5 kg)   SpO2 97%   BMI 27.69 kg/m   Wt Readings from Last 3 Encounters:  01/28/24 193 lb (87.5 kg)  10/17/23 194 lb (88 kg)  10/03/23 194 lb (88 kg)    Physical Exam Vitals and nursing note reviewed.  Constitutional:      General: He is not in acute distress.    Appearance: He is well-developed.  He is not diaphoretic.  Eyes:     General: No scleral icterus.    Conjunctiva/sclera: Conjunctivae normal.  Neck:     Thyroid : No thyromegaly.  Cardiovascular:     Rate and Rhythm: Normal rate and regular rhythm.     Heart sounds: Normal heart sounds. No murmur heard. Pulmonary:     Effort: Pulmonary effort is normal. No respiratory distress.     Breath sounds: Normal breath sounds. No wheezing.  Musculoskeletal:        General: No swelling. Normal range of motion.     Cervical back: Neck supple.  Lymphadenopathy:     Cervical: No cervical adenopathy.  Skin:    General: Skin is warm and dry.     Findings: No rash.  Neurological:     Mental Status: He is alert and oriented to  person, place, and time.     Coordination: Coordination normal.  Psychiatric:        Behavior: Behavior normal.       Assessment & Plan:   Problem List Items Addressed This Visit       Cardiovascular and Mediastinum   Hypertension associated with diabetes (HCC)   Relevant Medications   glimepiride  (AMARYL ) 4 MG tablet   lisinopril  (ZESTRIL ) 10 MG tablet   metFORMIN  (GLUCOPHAGE ) 1000 MG tablet   Other Relevant Orders   Bayer DCA Hb A1c Waived   CBC with Differential/Platelet   CMP14+EGFR   Lipid panel     Endocrine   Type 2 diabetes mellitus with other specified complication (HCC) - Primary   Relevant Medications   glimepiride  (AMARYL ) 4 MG tablet   lisinopril  (ZESTRIL ) 10 MG tablet   metFORMIN  (GLUCOPHAGE ) 1000 MG tablet   Other Relevant Orders   Bayer DCA Hb A1c Waived   CBC with Differential/Platelet   CMP14+EGFR   Lipid panel   Hypothyroidism   Relevant Medications   levothyroxine  (SYNTHROID ) 125 MCG tablet   Other Relevant Orders   TSH     Genitourinary   CKD (chronic kidney disease), stage III (HCC)   Relevant Orders   Bayer DCA Hb A1c Waived   CBC with Differential/Platelet   CMP14+EGFR   Lipid panel   Other Visit Diagnoses       Prostate cancer screening         Nocturia       Relevant Orders   PSA, total and free     Essential hypertension, benign       Relevant Medications   lisinopril  (ZESTRIL ) 10 MG tablet     A1c looks good at 6.6.  Blood pressure looks stable at 140s, allowing permissive hypertension for age.  Continue to monitor closely.  Follow up plan: Return in about 6 months (around 07/30/2024), or if symptoms worsen or fail to improve, for Diabetes and hypertension recheck.  Counseling provided for all of the vaccine components Orders Placed This Encounter  Procedures   Bayer DCA Hb A1c Waived   CBC with Differential/Platelet   CMP14+EGFR   Lipid panel   PSA, total and free   TSH    Jolyne Needs, MD [facility] 01/28/2024, 2:01 PM

## 2024-01-29 LAB — CBC WITH DIFFERENTIAL/PLATELET
Basophils Absolute: 0.1 10*3/uL (ref 0.0–0.2)
Basos: 1 %
EOS (ABSOLUTE): 0.2 10*3/uL (ref 0.0–0.4)
Eos: 3 %
Hematocrit: 36.1 % — ABNORMAL LOW (ref 37.5–51.0)
Hemoglobin: 11.6 g/dL — ABNORMAL LOW (ref 13.0–17.7)
Immature Grans (Abs): 0 10*3/uL (ref 0.0–0.1)
Immature Granulocytes: 0 %
Lymphocytes Absolute: 1.5 10*3/uL (ref 0.7–3.1)
Lymphs: 22 %
MCH: 30.9 pg (ref 26.6–33.0)
MCHC: 32.1 g/dL (ref 31.5–35.7)
MCV: 96 fL (ref 79–97)
Monocytes Absolute: 0.6 10*3/uL (ref 0.1–0.9)
Monocytes: 8 %
Neutrophils Absolute: 4.6 10*3/uL (ref 1.4–7.0)
Neutrophils: 66 %
Platelets: 152 10*3/uL (ref 150–450)
RBC: 3.75 x10E6/uL — ABNORMAL LOW (ref 4.14–5.80)
RDW: 12.8 % (ref 11.6–15.4)
WBC: 7 10*3/uL (ref 3.4–10.8)

## 2024-01-29 LAB — TSH: TSH: 4.63 u[IU]/mL — ABNORMAL HIGH (ref 0.450–4.500)

## 2024-01-29 LAB — LIPID PANEL
Cholesterol, Total: 138 mg/dL (ref 100–199)
HDL: 40 mg/dL (ref >39)
LDL CALC COMMENT:: 3.5 ratio (ref 0.0–5.0)
LDL Chol Calc (NIH): 61 mg/dL (ref 0–99)
Triglycerides: 225 mg/dL — ABNORMAL HIGH (ref 0–149)
VLDL Cholesterol Cal: 37 mg/dL (ref 5–40)

## 2024-01-29 LAB — CMP14+EGFR
ALT: 18 IU/L (ref 0–44)
AST: 20 IU/L (ref 0–40)
Albumin: 3.7 g/dL — ABNORMAL LOW (ref 3.8–4.8)
Alkaline Phosphatase: 57 IU/L (ref 44–121)
BUN/Creatinine Ratio: 15 (ref 10–24)
BUN: 28 mg/dL — ABNORMAL HIGH (ref 8–27)
Bilirubin Total: 0.3 mg/dL (ref 0.0–1.2)
CO2: 22 mmol/L (ref 20–29)
Calcium: 9.3 mg/dL (ref 8.6–10.2)
Chloride: 108 mmol/L — ABNORMAL HIGH (ref 96–106)
Creatinine, Ser: 1.81 mg/dL — ABNORMAL HIGH (ref 0.76–1.27)
Globulin, Total: 2.9 g/dL (ref 1.5–4.5)
Glucose: 134 mg/dL — ABNORMAL HIGH (ref 70–99)
Potassium: 5.5 mmol/L — ABNORMAL HIGH (ref 3.5–5.2)
Sodium: 142 mmol/L (ref 134–144)
Total Protein: 6.6 g/dL (ref 6.0–8.5)
eGFR: 39 mL/min/{1.73_m2} — ABNORMAL LOW (ref >59)

## 2024-01-29 LAB — PSA, TOTAL AND FREE
PSA, Free Pct: 53.5 %
PSA, Free: 1.39 ng/mL
Prostate Specific Ag, Serum: 2.6 ng/mL (ref 0.0–4.0)

## 2024-01-30 ENCOUNTER — Other Ambulatory Visit: Payer: Self-pay | Admitting: Family Medicine

## 2024-02-05 ENCOUNTER — Other Ambulatory Visit: Payer: Self-pay | Admitting: Family Medicine

## 2024-02-05 ENCOUNTER — Ambulatory Visit: Payer: Self-pay | Admitting: Family Medicine

## 2024-02-05 MED ORDER — LEVOTHYROXINE SODIUM 137 MCG PO TABS: 137.0000 ug | ORAL_TABLET | Freq: Every day | ORAL | 1 refills | Status: DC

## 2024-03-31 ENCOUNTER — Inpatient Hospital Stay (HOSPITAL_COMMUNITY)
Admission: EM | Admit: 2024-03-31 | Discharge: 2024-04-03 | DRG: 683 | Disposition: A | Discharge status: Home / Self Care | Attending: Internal Medicine | Admitting: Internal Medicine

## 2024-03-31 ENCOUNTER — Ambulatory Visit: Admitting: Family Medicine

## 2024-03-31 ENCOUNTER — Encounter: Payer: Self-pay | Admitting: Family Medicine

## 2024-03-31 ENCOUNTER — Observation Stay (HOSPITAL_COMMUNITY)

## 2024-03-31 ENCOUNTER — Encounter (HOSPITAL_COMMUNITY): Payer: Self-pay

## 2024-03-31 ENCOUNTER — Ambulatory Visit

## 2024-03-31 ENCOUNTER — Other Ambulatory Visit: Payer: Self-pay

## 2024-03-31 VITALS — BP 141/68 | HR 106 | Temp 98.6°F | Ht 70.0 in

## 2024-03-31 DIAGNOSIS — N200 Calculus of kidney: Secondary | ICD-10-CM | POA: Diagnosis present

## 2024-03-31 DIAGNOSIS — R29898 Other symptoms and signs involving the musculoskeletal system: Secondary | ICD-10-CM | POA: Diagnosis not present

## 2024-03-31 DIAGNOSIS — E875 Hyperkalemia: Secondary | ICD-10-CM | POA: Diagnosis present

## 2024-03-31 DIAGNOSIS — Z79890 Hormone replacement therapy (postmenopausal): Secondary | ICD-10-CM

## 2024-03-31 DIAGNOSIS — D696 Thrombocytopenia, unspecified: Secondary | ICD-10-CM | POA: Diagnosis present

## 2024-03-31 DIAGNOSIS — Z833 Family history of diabetes mellitus: Secondary | ICD-10-CM

## 2024-03-31 DIAGNOSIS — N179 Acute kidney failure, unspecified: Secondary | ICD-10-CM | POA: Diagnosis not present

## 2024-03-31 DIAGNOSIS — E1169 Type 2 diabetes mellitus with other specified complication: Secondary | ICD-10-CM | POA: Diagnosis present

## 2024-03-31 DIAGNOSIS — E860 Dehydration: Secondary | ICD-10-CM | POA: Diagnosis present

## 2024-03-31 DIAGNOSIS — R110 Nausea: Secondary | ICD-10-CM | POA: Diagnosis not present

## 2024-03-31 DIAGNOSIS — Z79899 Other long term (current) drug therapy: Secondary | ICD-10-CM

## 2024-03-31 DIAGNOSIS — I152 Hypertension secondary to endocrine disorders: Secondary | ICD-10-CM | POA: Diagnosis present

## 2024-03-31 DIAGNOSIS — N1832 Chronic kidney disease, stage 3b: Secondary | ICD-10-CM | POA: Diagnosis present

## 2024-03-31 DIAGNOSIS — E11649 Type 2 diabetes mellitus with hypoglycemia without coma: Secondary | ICD-10-CM | POA: Diagnosis present

## 2024-03-31 DIAGNOSIS — M6282 Rhabdomyolysis: Secondary | ICD-10-CM | POA: Diagnosis present

## 2024-03-31 DIAGNOSIS — E861 Hypovolemia: Secondary | ICD-10-CM | POA: Diagnosis present

## 2024-03-31 DIAGNOSIS — E871 Hypo-osmolality and hyponatremia: Secondary | ICD-10-CM | POA: Diagnosis present

## 2024-03-31 DIAGNOSIS — R42 Dizziness and giddiness: Secondary | ICD-10-CM | POA: Diagnosis not present

## 2024-03-31 DIAGNOSIS — D631 Anemia in chronic kidney disease: Secondary | ICD-10-CM | POA: Diagnosis present

## 2024-03-31 DIAGNOSIS — E872 Acidosis, unspecified: Secondary | ICD-10-CM | POA: Diagnosis present

## 2024-03-31 DIAGNOSIS — R0602 Shortness of breath: Secondary | ICD-10-CM | POA: Diagnosis not present

## 2024-03-31 DIAGNOSIS — E039 Hypothyroidism, unspecified: Secondary | ICD-10-CM | POA: Diagnosis present

## 2024-03-31 DIAGNOSIS — E1122 Type 2 diabetes mellitus with diabetic chronic kidney disease: Secondary | ICD-10-CM | POA: Diagnosis present

## 2024-03-31 DIAGNOSIS — Z885 Allergy status to narcotic agent status: Secondary | ICD-10-CM

## 2024-03-31 DIAGNOSIS — R000 Tachycardia, unspecified: Secondary | ICD-10-CM

## 2024-03-31 DIAGNOSIS — T679XXA Effect of heat and light, unspecified, initial encounter: Secondary | ICD-10-CM

## 2024-03-31 DIAGNOSIS — N183 Chronic kidney disease, stage 3 unspecified: Secondary | ICD-10-CM | POA: Diagnosis present

## 2024-03-31 DIAGNOSIS — Z7984 Long term (current) use of oral hypoglycemic drugs: Secondary | ICD-10-CM

## 2024-03-31 DIAGNOSIS — H538 Other visual disturbances: Secondary | ICD-10-CM

## 2024-03-31 DIAGNOSIS — E1159 Type 2 diabetes mellitus with other circulatory complications: Secondary | ICD-10-CM | POA: Diagnosis present

## 2024-03-31 HISTORY — DX: Essential (primary) hypertension: I10

## 2024-03-31 LAB — BASIC METABOLIC PANEL WITH GFR
Anion gap: 19 — ABNORMAL HIGH (ref 5–15)
BUN: 99 mg/dL — ABNORMAL HIGH (ref 8–23)
CO2: 10 mmol/L — ABNORMAL LOW (ref 22–32)
Calcium: 8.7 mg/dL — ABNORMAL LOW (ref 8.9–10.3)
Chloride: 101 mmol/L (ref 98–111)
Creatinine, Ser: 8.34 mg/dL — ABNORMAL HIGH (ref 0.61–1.24)
GFR, Estimated: 6 mL/min — ABNORMAL LOW (ref >60)
Glucose, Bld: 97 mg/dL (ref 70–99)
Potassium: 5.3 mmol/L — ABNORMAL HIGH (ref 3.5–5.1)
Sodium: 130 mmol/L — ABNORMAL LOW (ref 135–145)

## 2024-03-31 LAB — DIFFERENTIAL
Abs Immature Granulocytes: 0.09 K/uL — ABNORMAL HIGH (ref 0.00–0.07)
Basophils Absolute: 0 K/uL (ref 0.0–0.1)
Basophils Relative: 0 %
Eosinophils Absolute: 0 K/uL (ref 0.0–0.5)
Eosinophils Relative: 0 %
Immature Granulocytes: 1 %
Lymphocytes Relative: 3 %
Lymphs Abs: 0.5 K/uL — ABNORMAL LOW (ref 0.7–4.0)
Monocytes Absolute: 0.9 K/uL (ref 0.1–1.0)
Monocytes Relative: 6 %
Neutro Abs: 13.9 K/uL — ABNORMAL HIGH (ref 1.7–7.7)
Neutrophils Relative %: 90 %

## 2024-03-31 LAB — CBC
HCT: 33.8 % — ABNORMAL LOW (ref 39.0–52.0)
Hemoglobin: 11.5 g/dL — ABNORMAL LOW (ref 13.0–17.0)
MCH: 31.8 pg (ref 26.0–34.0)
MCHC: 34 g/dL (ref 30.0–36.0)
MCV: 93.4 fL (ref 80.0–100.0)
Platelets: 148 K/uL — ABNORMAL LOW (ref 150–400)
RBC: 3.62 MIL/uL — ABNORMAL LOW (ref 4.22–5.81)
RDW: 13.2 % (ref 11.5–15.5)
WBC: 15.4 K/uL — ABNORMAL HIGH (ref 4.0–10.5)
nRBC: 0 % (ref 0.0–0.2)

## 2024-03-31 LAB — GLUCOSE, CAPILLARY
Glucose-Capillary: 34 mg/dL — CL (ref 70–99)
Glucose-Capillary: 45 mg/dL — ABNORMAL LOW (ref 70–99)
Glucose-Capillary: 109 mg/dL — ABNORMAL HIGH (ref 70–99)
Glucose-Capillary: 92 mg/dL (ref 70–99)
Glucose-Capillary: 76 mg/dL (ref 70–99)

## 2024-03-31 LAB — RETICULOCYTES
Immature Retic Fract: 17 % — ABNORMAL HIGH (ref 2.3–15.9)
RBC.: 3.6 MIL/uL — ABNORMAL LOW (ref 4.22–5.81)
Retic Count, Absolute: 47.2 K/uL (ref 19.0–186.0)
Retic Ct Pct: 1.3 % (ref 0.4–3.1)

## 2024-03-31 LAB — RAPID URINE DRUG SCREEN, HOSP PERFORMED
Amphetamines: NOT DETECTED
Barbiturates: NOT DETECTED
Benzodiazepines: NOT DETECTED
Cocaine: NOT DETECTED
Opiates: NOT DETECTED
Tetrahydrocannabinol: NOT DETECTED

## 2024-03-31 LAB — CK: Total CK: 925 U/L — ABNORMAL HIGH (ref 49–397)

## 2024-03-31 LAB — IRON AND TIBC
Iron: 73 ug/dL (ref 45–182)
Saturation Ratios: 28 % (ref 17.9–39.5)
TIBC: 265 ug/dL (ref 250–450)
UIBC: 192 ug/dL

## 2024-03-31 LAB — COMPREHENSIVE METABOLIC PANEL WITH GFR
ALT: 23 U/L (ref 0–44)
AST: 37 U/L (ref 15–41)
Albumin: 3.8 g/dL (ref 3.5–5.0)
Alkaline Phosphatase: 53 U/L (ref 38–126)
Anion gap: 21 — ABNORMAL HIGH (ref 5–15)
BUN: 102 mg/dL — ABNORMAL HIGH (ref 8–23)
CO2: 10 mmol/L — ABNORMAL LOW (ref 22–32)
Calcium: 9.6 mg/dL (ref 8.9–10.3)
Chloride: 98 mmol/L (ref 98–111)
Creatinine, Ser: 9.39 mg/dL — ABNORMAL HIGH (ref 0.61–1.24)
GFR, Estimated: 5 mL/min — ABNORMAL LOW (ref >60)
Glucose, Bld: 35 mg/dL — CL (ref 70–99)
Potassium: 5.7 mmol/L — ABNORMAL HIGH (ref 3.5–5.1)
Sodium: 129 mmol/L — ABNORMAL LOW (ref 135–145)
Total Bilirubin: 1.1 mg/dL (ref 0.0–1.2)
Total Protein: 7.6 g/dL (ref 6.5–8.1)

## 2024-03-31 LAB — CBG MONITORING, ED
Glucose-Capillary: 165 mg/dL — ABNORMAL HIGH (ref 70–99)
Glucose-Capillary: 34 mg/dL — CL (ref 70–99)
Glucose-Capillary: 90 mg/dL (ref 70–99)

## 2024-03-31 LAB — SODIUM, URINE, RANDOM: Sodium, Ur: 74 mmol/L

## 2024-03-31 LAB — VITAMIN B12: Vitamin B-12: 148 pg/mL — ABNORMAL LOW (ref 180–914)

## 2024-03-31 LAB — PROTIME-INR
INR: 1.1 (ref 0.8–1.2)
Prothrombin Time: 14.8 s (ref 11.4–15.2)

## 2024-03-31 LAB — ETHANOL: Alcohol, Ethyl (B): <15 mg/dL (ref <15)

## 2024-03-31 LAB — FOLATE: Folate: 3.8 ng/mL — ABNORMAL LOW (ref >5.9)

## 2024-03-31 LAB — APTT: aPTT: 29 s (ref 24–36)

## 2024-03-31 LAB — CREATININE, URINE, RANDOM: Creatinine, Urine: 113 mg/dL

## 2024-03-31 LAB — HEMOGLOBIN A1C
Hgb A1c MFr Bld: 7 % — ABNORMAL HIGH (ref 4.8–5.6)
Mean Plasma Glucose: 154.2 mg/dL

## 2024-03-31 LAB — FERRITIN: Ferritin: 97 ng/mL (ref 24–336)

## 2024-03-31 MED ORDER — DEXTROSE 50 % IV SOLN
INTRAVENOUS | Status: AC
  Administered 2024-03-31: 50 mL via INTRAVENOUS
  Filled 2024-03-31: qty 50

## 2024-03-31 MED ORDER — SODIUM CHLORIDE 0.9 % IV SOLN
100.0000 mL/h | INTRAVENOUS | Status: DC
  Administered 2024-03-31 (×2): 100 mL/h via INTRAVENOUS

## 2024-03-31 MED ORDER — ACETAMINOPHEN 650 MG RE SUPP: 650.0000 mg | Freq: Four times a day (QID) | RECTAL | Status: DC | PRN

## 2024-03-31 MED ORDER — DEXTROSE 50 % IV SOLN: 50.0000 mL | Freq: Once | INTRAVENOUS | Status: AC

## 2024-03-31 MED ORDER — LACTATED RINGERS IV BOLUS
2000.0000 mL | Freq: Once | INTRAVENOUS | Status: AC
  Administered 2024-03-31: 2000 mL via INTRAVENOUS

## 2024-03-31 MED ORDER — INSULIN ASPART 100 UNIT/ML IJ SOLN
0.0000 [IU] | INTRAMUSCULAR | Status: DC
  Administered 2024-04-01: 5 [IU] via SUBCUTANEOUS

## 2024-03-31 MED ORDER — ONDANSETRON HCL 4 MG/2ML IJ SOLN
4.0000 mg | Freq: Four times a day (QID) | INTRAMUSCULAR | Status: DC | PRN
  Administered 2024-03-31 – 2024-04-02 (×4): 4 mg via INTRAVENOUS
  Filled 2024-03-31 (×4): qty 2

## 2024-03-31 MED ORDER — ONDANSETRON HCL 4 MG PO TABS: 4.0000 mg | ORAL_TABLET | Freq: Four times a day (QID) | ORAL | Status: DC | PRN

## 2024-03-31 MED ORDER — DEXTROSE 50 % IV SOLN
25.0000 g | INTRAVENOUS | Status: AC
  Administered 2024-03-31: 25 g via INTRAVENOUS
  Filled 2024-03-31: qty 50

## 2024-03-31 MED ORDER — DEXTROSE-SODIUM CHLORIDE 5-0.9 % IV SOLN: INTRAVENOUS | Status: DC

## 2024-03-31 MED ORDER — LEVOTHYROXINE SODIUM 137 MCG PO TABS
137.0000 ug | ORAL_TABLET | Freq: Every day | ORAL | Status: DC
  Administered 2024-04-01 – 2024-04-03 (×3): 137 ug via ORAL
  Filled 2024-03-31 (×3): qty 1

## 2024-03-31 MED ORDER — INSULIN ASPART 100 UNIT/ML IJ SOLN: 0.0000 [IU] | Freq: Every day | INTRAMUSCULAR | Status: DC

## 2024-03-31 MED ORDER — ONDANSETRON HCL 4 MG/2ML IJ SOLN
4.0000 mg | Freq: Once | INTRAMUSCULAR | Status: AC
  Administered 2024-03-31: 4 mg via INTRAVENOUS
  Filled 2024-03-31: qty 2

## 2024-03-31 MED ORDER — SODIUM ZIRCONIUM CYCLOSILICATE 10 G PO PACK
10.0000 g | PACK | Freq: Once | ORAL | Status: AC
  Administered 2024-03-31: 10 g via ORAL
  Filled 2024-03-31: qty 1

## 2024-03-31 MED ORDER — SODIUM CHLORIDE 0.9 % IV BOLUS
500.0000 mL | Freq: Once | INTRAVENOUS | Status: AC
  Administered 2024-03-31: 500 mL via INTRAVENOUS

## 2024-03-31 MED ORDER — INSULIN ASPART 100 UNIT/ML IJ SOLN: 0.0000 [IU] | Freq: Three times a day (TID) | INTRAMUSCULAR | Status: DC

## 2024-03-31 MED ORDER — ACETAMINOPHEN 325 MG PO TABS: 650.0000 mg | ORAL_TABLET | Freq: Four times a day (QID) | ORAL | Status: DC | PRN

## 2024-03-31 NOTE — Progress Notes (Addendum)
 Informed of patient in ER. Presents with possible heat related illness vs CVA. Apparently seems dry on exam. Found to have AKI on CKD. Na 129, K 5.7, BUN 102, Cr 9.39, CK 925, WBC 15.4, Hgb 11.5.  Recommendations: -check ct renal stone, rule out obstruction -agree with fluids -hold home lisinopril  and metformin  for now -will order a dose of lokelma  now -recommend rechecking met panel this evening -full consult to follow in AM -please call on-call nephrology with any questions/concerns  Ephriam Stank, MD [facility]

## 2024-03-31 NOTE — ED Triage Notes (Signed)
 Pt arrived via POV following a PCP appointment this morning for evaluation of possible stroke/ heat stroke/ dehydration and increasing weakness. Pt reports he has been working outside in the heat a lot since the weekend.

## 2024-03-31 NOTE — Progress Notes (Signed)
 Subjective:  Patient ID: Patrick [name], male    DOB: [birth year], 78 y.o.   MRN: [id number]  Patient Care Team: Dettinger, Fonda LABOR, MD as PCP - General (Family Medicine) Billee Mliss BIRCH, [facility] (Pharmacist)   Chief Complaint:  Shortness of Breath (Since getting over heated Monday.  Also c/o dizziness /)   HPI: Patrick [name] is a 78 y.o. male presenting on 03/31/2024 for Shortness of Breath (Since getting over heated Monday.  Also c/o dizziness /)   Patrick [name] is a 78 year old male who presents with dizziness, right leg weakness, and nausea.  On Monday, he experienced dizziness and lightheadedness while working. These symptoms have improved slightly but persist. He also has nausea and occasional shortness of breath since Monday. No headaches or significant visual changes, though there is slight blurriness in vision.  He experienced numbness and weakness in both legs, with the right leg being more affected. This weakness made walking difficult, though he was still able to walk. He describes feeling 'weak walking.'  No confusion, slurred speech, or passing out. He reports urinating four to five times a day and states he did not drink much on Monday due to lack of water  availability.          Relevant past medical, surgical, family, and social history reviewed and updated as indicated.  Allergies and medications reviewed and updated. Data reviewed: Chart in Epic.   Past Medical History:  Diagnosis Date   Diabetes mellitus without complication (HCC)    Thyroid  disease     Past Surgical History:  Procedure Laterality Date   None      Social History   Socioeconomic History   Marital status: Single    Spouse name: Not on file   Number of children: Not on file   Years of education: Not on file   Highest education level: Not on file  Occupational History   Not on file  Tobacco Use   Smoking status: Never   Smokeless tobacco: Never  Vaping Use   Vaping status:  Never Used  Substance and Sexual Activity   Alcohol use: No   Drug use: No   Sexual activity: Not on file  Other Topics Concern   Not on file  Social History Narrative   Not on file   Social Drivers of Health   Financial Resource Strain: Low Risk  (11/13/2022)   Overall Financial Resource Strain (CARDIA)    Difficulty of Paying Living Expenses: Not hard at all  Food Insecurity: No Food Insecurity (11/13/2022)   Hunger Vital Sign    Worried About Running Out of Food in the Last Year: Never true    Ran Out of Food in the Last Year: Never true  Transportation Needs: No Transportation Needs (11/13/2022)   PRAPARE - Administrator, Civil Service (Medical): No    Lack of Transportation (Non-Medical): No  Physical Activity: Sufficiently Active (11/13/2022)   Exercise Vital Sign    Days of Exercise per Week: 5 days    Minutes of Exercise per Session: 60 min  Stress: No Stress Concern Present (11/13/2022)   [employer] of Occupational Health - Occupational Stress Questionnaire    Feeling of Stress : Not at all  Social Connections: Moderately Isolated (11/13/2022)   Social Connection and Isolation Panel    Frequency of Communication with Friends and Family: More than three times a week    Frequency of Social Gatherings with Friends and Family: More than  three times a week    Attends Religious Services: 1 to 4 times per year    Active Member of Clubs or Organizations: No    Attends Banker Meetings: Never    Marital Status: Never married  Intimate Partner Violence: Not At Risk (11/13/2022)   Humiliation, Afraid, Rape, and Kick questionnaire    Fear of Current or Ex-Partner: No    Emotionally Abused: No    Physically Abused: No    Sexually Abused: No    Outpatient Encounter Medications as of 03/31/2024  Medication Sig   ferrous sulfate  (SV IRON ) 325 (65 FE) MG tablet Take 1 tablet by mouth once daily   glimepiride  (AMARYL ) 4 MG tablet Take 1 tablet (4 mg  total) by mouth daily before breakfast.   levothyroxine  (SYNTHROID ) 137 MCG tablet Take 1 tablet (137 mcg total) by mouth daily before breakfast.   lisinopril  (ZESTRIL ) 10 MG tablet Take 1 tablet (10 mg total) by mouth daily.   metFORMIN  (GLUCOPHAGE ) 1000 MG tablet Take 1 tablet (1,000 mg total) by mouth 2 (two) times daily with a meal.   No facility-administered encounter medications on file as of 03/31/2024.    Allergies  Allergen Reactions   Codeine     Pertinent ROS per HPI, otherwise unremarkable      Objective:  BP (!) 141/68   Pulse (!) 106   Temp 98.6 F (37 C)   Ht 5' 10 (1.778 m)   SpO2 95%   BMI 27.69 kg/m    Wt Readings from Last 3 Encounters:  01/28/24 193 lb (87.5 kg)  10/17/23 194 lb (88 kg)  10/03/23 194 lb (88 kg)    Physical Exam Vitals and nursing note reviewed.  Constitutional:      Appearance: He is well-developed and overweight. He is ill-appearing. He is not toxic-appearing or diaphoretic.  HENT:     Head: Normocephalic and atraumatic.     Right Ear: Tympanic membrane, ear canal and external ear normal.     Left Ear: Tympanic membrane, ear canal and external ear normal.     Nose: Nose normal.     Mouth/Throat:     Mouth: Mucous membranes are moist.  Eyes:     Conjunctiva/sclera: Conjunctivae normal.     Pupils: Pupils are equal, round, and reactive to light.  Cardiovascular:     Rate and Rhythm: Regular rhythm. Tachycardia present.     Heart sounds: Normal heart sounds.  Pulmonary:     Effort: Pulmonary effort is normal.     Breath sounds: No wheezing.  Abdominal:     Palpations: Abdomen is soft.  Musculoskeletal:     Cervical back: Neck supple.     Right lower leg: No edema.     Left lower leg: No edema.  Skin:    General: Skin is warm.     Capillary Refill: Capillary refill takes less than 2 seconds.     Comments: Delayed turgor  Neurological:     General: No focal deficit present.     Mental Status: He is alert and oriented to  person, place, and time.     Cranial Nerves: No cranial nerve deficit.     Motor: Weakness (minimal in RLE) present.     Gait: Tandem walk abnormal.  Psychiatric:        Mood and Affect: Mood normal.        Behavior: Behavior normal. Behavior is cooperative.        Thought Content: Thought  content normal.        Judgment: Judgment normal.     Results for orders placed or performed in visit on 01/28/24  Bayer DCA Hb A1c Waived   Collection Time: 01/28/24  1:16 PM  Result Value Ref Range   HB A1C (BAYER DCA - WAIVED) 6.6 (H) 4.8 - 5.6 %  CBC with Differential/Platelet   Collection Time: 01/28/24  1:22 PM  Result Value Ref Range   WBC 7.0 3.4 - 10.8 x10E3/uL   RBC 3.75 (L) 4.14 - 5.80 x10E6/uL   Hemoglobin 11.6 (L) 13.0 - 17.7 g/dL   Hematocrit 63.8 (L) 62.4 - 51.0 %   MCV 96 79 - 97 fL   MCH 30.9 26.6 - 33.0 pg   MCHC 32.1 31.5 - 35.7 g/dL   RDW 87.1 88.3 - 84.5 %   Platelets 152 150 - 450 x10E3/uL   Neutrophils 66 Not Estab. %   Lymphs 22 Not Estab. %   Monocytes 8 Not Estab. %   Eos 3 Not Estab. %   Basos 1 Not Estab. %   Neutrophils Absolute 4.6 1.4 - 7.0 x10E3/uL   Lymphocytes Absolute 1.5 0.7 - 3.1 x10E3/uL   Monocytes Absolute 0.6 0.1 - 0.9 x10E3/uL   EOS (ABSOLUTE) 0.2 0.0 - 0.4 x10E3/uL   Basophils Absolute 0.1 0.0 - 0.2 x10E3/uL   Immature Granulocytes 0 Not Estab. %   Immature Grans (Abs) 0.0 0.0 - 0.1 x10E3/uL  CMP14+EGFR   Collection Time: 01/28/24  1:22 PM  Result Value Ref Range   Glucose 134 (H) 70 - 99 mg/dL   BUN 28 (H) 8 - 27 mg/dL   Creatinine, Ser 8.18 (H) 0.76 - 1.27 mg/dL   eGFR 39 (L) >40 fO/fpw/8.26   BUN/Creatinine Ratio 15 10 - 24   Sodium 142 134 - 144 mmol/L   Potassium 5.5 (H) 3.5 - 5.2 mmol/L   Chloride 108 (H) 96 - 106 mmol/L   CO2 22 20 - 29 mmol/L   Calcium 9.3 8.6 - 10.2 mg/dL   Total Protein 6.6 6.0 - 8.5 g/dL   Albumin 3.7 (L) 3.8 - 4.8 g/dL   Globulin, Total 2.9 1.5 - 4.5 g/dL   Bilirubin Total 0.3 0.0 - 1.2 mg/dL   Alkaline  Phosphatase 57 44 - 121 IU/L   AST 20 0 - 40 IU/L   ALT 18 0 - 44 IU/L  Lipid panel   Collection Time: 01/28/24  1:22 PM  Result Value Ref Range   Cholesterol, Total 138 100 - 199 mg/dL   Triglycerides 774 (H) 0 - 149 mg/dL   HDL 40 >60 mg/dL   VLDL Cholesterol Cal 37 5 - 40 mg/dL   LDL Chol Calc (NIH) 61 0 - 99 mg/dL   Chol/HDL Ratio 3.5 0.0 - 5.0 ratio  PSA, total and free   Collection Time: 01/28/24  1:22 PM  Result Value Ref Range   Prostate Specific Ag, Serum 2.6 0.0 - 4.0 ng/mL   PSA, Free 1.39 N/A ng/mL   PSA, Free Pct 53.5 %  TSH   Collection Time: 01/28/24  1:22 PM  Result Value Ref Range   TSH 4.630 (H) 0.450 - 4.500 uIU/mL       Pertinent labs & imaging results that were available during my care of the patient were reviewed by me and considered in my medical decision making.  Assessment & Plan:  Peyson was seen today for shortness of breath.  Diagnoses and all orders for this visit:  Dizzinesses -     CMP14+EGFR -     CBC with Differential/Platelet -     CK total and CKMB (cardiac)not at [facility] -     CT HEAD WO CONTRAST ( ); Future  Right leg weakness -     CMP14+EGFR -     CBC with Differential/Platelet -     CK total and CKMB (cardiac)not at [facility] -     CT HEAD WO CONTRAST ( ); Future  Shortness of breath -     CMP14+EGFR -     CBC with Differential/Platelet -     CK total and CKMB (cardiac)not at [facility] -     CT HEAD WO CONTRAST ( ); Future  Nausea -     CMP14+EGFR -     CBC with Differential/Platelet -     CK total and CKMB (cardiac)not at [facility] -     CT HEAD WO CONTRAST ( ); Future  Blurred vision -     CMP14+EGFR -     CBC with Differential/Platelet -     CK total and CKMB (cardiac)not at [facility] -     CT HEAD WO CONTRAST ( ); Future  Heat exposure, initial encounter -     CMP14+EGFR -     CBC with Differential/Platelet -     CK total and CKMB (cardiac)not at [facility] -     CT HEAD WO CONTRAST ( ); Future  Tachycardia -      CMP14+EGFR -     CBC with Differential/Platelet -     CK total and CKMB (cardiac)not at [facility] -     CT HEAD WO CONTRAST ( ); Future     Assessment and Plan    Possible acute cerebrovascular accident (stroke) versus heat-related illness Presentation includes right leg weakness, dizziness, lightheadedness, nausea, and shortness of breath, which began on Monday. Symptoms are concerning for a possible acute cerebrovascular accident (stroke) or heat-related illness. Right leg weakness was more pronounced, and there was some blurriness in vision. Neurological examination was normal, but symptoms suggest a possible stroke or heat-related illness. Dehydration on Monday due to lack of water  intake may have contributed to symptoms. Differential diagnosis includes heat stroke and cerebrovascular accident. - Order stat head CT scan - Order stat labs - Administer IV fluids - Arrange transport to hospital for further evaluation and management          Continue all other maintenance medications.  Follow up plan: Return for ED for labs and imaging, fluids.    The above assessment and management plan was discussed with the patient. The patient verbalized understanding of and has agreed to the management plan. Patient is aware to call the clinic if they develop any new symptoms or if symptoms persist or worsen. Patient is aware when to return to the clinic for a follow-up visit. Patient educated on when it is appropriate to go to the emergency department.   Rosaline Bruns, FNP-C [facility] [phone]

## 2024-03-31 NOTE — Progress Notes (Signed)
 Hypoglycemic Event  CBG: 34  Treatment: 8 oz juice/soda  Symptoms: Sweaty  Follow-up CBG: Time:1620 CBG Result:45  D50 ordered due to insufficient increase in capillary blood glucose level after oral intake.   Patient went off unit for CT scan delaying follow up blood sugar check. Follow Up CBG Time: 1723     CBG Result: 109  Possible Reasons for Event: Inadequate meal intake  Comments/MD notified: MD Maree notified.  MD Maree advised low blood sugars will likely persist as patient was on glimepiride . Hopefully this will start to improve as he begins to eat. If he is not able to tolerate diet, let MD know and we will have to change IV fluid to D5 NS.   Blood sugar orders changed to every 4 hours.   Pt unable to tolerate dinner and had episode of emesis.  MD Maree notified.     Vickey Ewbank A

## 2024-03-31 NOTE — ED Notes (Signed)
 Pts armband did not scan for POC CBG.

## 2024-03-31 NOTE — ED Notes (Signed)
 ED Provider at bedside.

## 2024-03-31 NOTE — ED Provider Notes (Signed)
 [facility] AT [facility] Provider Note   CSN: [id number] Arrival date & time: 03/31/24  1142     Patient presents with: Weakness   Patrick [name] is a 78 y.o. male.   HPI Patient presents via EMS from urgent care after he presented there with weakness, fatigue.  Patient notes that he was working outside on a weekend, raking hay.  He has been generally weak since then, and today went to urgent care was sent here for evaluation.  Patient denies focal weakness, confusion, is here with a colleague.     Prior to Admission medications   Medication Sig Start Date End Date Taking? Authorizing Provider  ferrous sulfate  (SV IRON ) 325 (65 FE) MG tablet Take 1 tablet by mouth once daily 01/30/24   Dettinger, Joshua A, MD  glimepiride  (AMARYL ) 4 MG tablet Take 1 tablet (4 mg total) by mouth daily before breakfast. 01/28/24   Dettinger, Fonda LABOR, MD  levothyroxine  (SYNTHROID ) 137 MCG tablet Take 1 tablet (137 mcg total) by mouth daily before breakfast. 02/05/24   Dettinger, Fonda LABOR, MD  lisinopril  (ZESTRIL ) 10 MG tablet Take 1 tablet (10 mg total) by mouth daily. 01/28/24   Dettinger, Fonda LABOR, MD  metFORMIN  (GLUCOPHAGE ) 1000 MG tablet Take 1 tablet (1,000 mg total) by mouth 2 (two) times daily with a meal. 01/28/24   Dettinger, Fonda LABOR, MD    Allergies: Codeine    Review of Systems  Updated Vital Signs BP (!) 146/65   Pulse 85   Temp 97.6 F (36.4 C) (Axillary)   Resp 20   Ht 5' 10 (1.778 m)   Wt 87.5 kg   SpO2 98%   BMI 27.68 kg/m   Physical Exam Vitals and nursing note reviewed.  Constitutional:      General: He is not in acute distress.    Appearance: He is well-developed.  HENT:     Head: Normocephalic and atraumatic.  Eyes:     Conjunctiva/sclera: Conjunctivae normal.  Cardiovascular:     Rate and Rhythm: Normal rate and regular rhythm.  Pulmonary:     Effort: Pulmonary effort is normal. No respiratory distress.     Breath sounds: No  stridor.  Abdominal:     General: There is no distension.  Skin:    General: Skin is warm and dry.  Neurological:     Mental Status: He is alert and oriented to person, place, and time.     (all labs ordered are listed, but only abnormal results are displayed) Labs Reviewed  CBC - Abnormal; Notable for the following components:      Result Value   WBC 15.4 (*)    RBC 3.62 (*)    Hemoglobin 11.5 (*)    HCT 33.8 (*)    Platelets 148 (*)    All other components within normal limits  DIFFERENTIAL - Abnormal; Notable for the following components:   Neutro Abs 13.9 (*)    Lymphs Abs 0.5 (*)    Abs Immature Granulocytes 0.09 (*)    All other components within normal limits  COMPREHENSIVE METABOLIC PANEL WITH GFR - Abnormal; Notable for the following components:   Sodium 129 (*)    Potassium 5.7 (*)    CO2 10 (*)    Glucose, Bld 35 (*)    BUN 102 (*)    Creatinine, Ser 9.39 (*)    GFR, Estimated 5 (*)    Anion gap 21 (*)    All other components  within normal limits  CK - Abnormal; Notable for the following components:   Total CK 925 (*)    All other components within normal limits  CBG MONITORING, ED - Abnormal; Notable for the following components:   Glucose-Capillary 34 (*)    All other components within normal limits  CBG MONITORING, ED - Abnormal; Notable for the following components:   Glucose-Capillary 165 (*)    All other components within normal limits  PROTIME-INR  APTT  ETHANOL  RAPID URINE DRUG SCREEN, HOSP PERFORMED  CBG MONITORING, ED    EKG: EKG Interpretation Date/Time:  Wednesday March 31 2024 11:54:18 EDT Ventricular Rate:  86 PR Interval:  60 QRS Duration:  114 QT Interval:  380 QTC Calculation: 455 R Axis:   36  Text Interpretation: Sinus rhythm Short PR interval Biatrial enlargement Artifact Confirmed by Garrick Charleston [phone]) on 03/31/2024 12:20:25 PM  Radiology: No results found.   Procedures   Medications Ordered in the ED  sodium  chloride 0.9 % bolus 500 mL (500 mLs Intravenous Bolus 03/31/24 1207)    Followed by  0.9 %  sodium chloride  infusion (100 mL/hr Intravenous New Bag/Given 03/31/24 1208)  ondansetron  (ZOFRAN ) injection 4 mg (has no administration in time range)  dextrose  50 % solution 50 mL (50 mLs Intravenous Given 03/31/24 1203)  lactated ringers  bolus 2,000 mL (2,000 mLs Intravenous New Bag/Given 03/31/24 1403)                                    Medical Decision Making Adult male, presents with weakness nonfocal.  Notably patient found be hypoglycemic on arrival, glucose 37.  He states that he has been compliant with his antihyperglycemic's, concern for crisis, received dextrose .  Broad differential including infection, dehydration, less likely stroke given his preserved distal neurovascular findings, absence of confusion.  Amount and/or Complexity of Data Reviewed Independent Historian: friend External Data Reviewed: notes.    Details: Urgent care notes reviewed Labs: ordered. Decision-making details documented in ED Course. Radiology: ordered and independent interpretation performed. Decision-making details documented in ED Course. ECG/medicine tests: ordered and independent interpretation performed. Decision-making details documented in ED Course.  Risk Prescription drug management. Decision regarding hospitalization.  Cardiac 85 sinus normal pulse ox 100% room air normal 2:31 PM Patient in similar condition, hemodynamically stable, we discussed findings thus far including acute renal failure, BUN greater than 100, creatinine nearly 10, potassium 5.5.  I discussed patient's case with our nephrology colleague, Dr. Dennise.  Fluids will continue.  Patient be admitted to our internal medicine colleagues, new renal failure, hyperkalemia.  Fluids continue, patient aware of need for admission, though he is eager to get back to work.  CRITICAL CARE Performed by: Charleston Garrick Total critical care time:  35 minutes Critical care time was exclusive of separately billable procedures and treating other patients. Critical care was necessary to treat or prevent imminent or life-threatening deterioration. Critical care was time spent personally by me on the following activities: development of treatment plan with patient and/or surrogate as well as nursing, discussions with consultants, evaluation of patient's response to treatment, examination of patient, obtaining history from patient or surrogate, ordering and performing treatments and interventions, ordering and review of laboratory studies, ordering and review of radiographic studies, pulse oximetry and re-evaluation of patient's condition.   Final diagnoses:  AKI (acute kidney injury) (HCC)  Hyperkalemia     Garrick Charleston, MD 03/31/24 1433

## 2024-03-31 NOTE — H&P (Addendum)
 History and Physical    Patrick [name] FMW:[id number] DOB: November 20, 1945 DOA: 03/31/2024  PCP: Dettinger, Fonda LABOR, MD   Patient coming from: PCP office  Chief Complaint: Weakness/fatigue  HPI: Patrick [name] is a 78 y.o. male with medical history significant for type 2 diabetes, hypertension, CKD stage IIIa, and hypothyroidism who presented to the ED via EMS from urgent care with complaints of worsening weakness and fatigue.  He was apparently raking hay outside over the weekend when it was really hot and he has felt generally weak since then.  He was seen at the urgent care facility and there complained of some right leg weakness as well as dizziness.  He has had some nausea and occasional shortness of breath, but no other headaches or neurological deficits noted.  He denies any loss of consciousness and states that he has been urinating several times a day, but did not drink much on Monday due to lack of water  availability.  He states that he usually takes his medications without missing any doses and did have 1 episode of vomiting in the ED, but denies any diarrhea.  He states that his dizziness and blurred vision have improved.  Friend at bedside states that he has had multiple ticks on his back that he had removed recently.   ED Course: Vital signs are stable and patient is otherwise afebrile.  Sodium 129, potassium 5.7, glucose 35, CO2 10, creatinine 9.39.  CK of 925.  WBC 15.4 and hemoglobin 11.5 with platelets of 148.  UDS negative.  He was noted to be hypoglycemic and required D50 and was given 500 mL fluid bolus and now 2 L ordered after consultation with nephrology.  Plans are for CT renal stone study and administration of Lokelma  for noted hyperkalemia.  EKG with no significant findings noted.  Review of Systems: Reviewed as noted above, otherwise negative.  Past Medical History:  Diagnosis Date   Diabetes mellitus without complication (HCC)    Hypertension    Thyroid  disease      Past Surgical History:  Procedure Laterality Date   None       reports that he has never smoked. He has never been exposed to tobacco smoke. He has never used smokeless tobacco. He reports that he does not drink alcohol and does not use drugs.  Allergies  Allergen Reactions   Codeine Other (See Comments)    Excessive sweating, lightheadedness. Led to a hospitalization.    Family History  Problem Relation Age of Onset   Diabetes Mother    Diabetes Brother    Colon cancer Neg Hx    Liver disease Neg Hx     Prior to Admission medications   Medication Sig Start Date End Date Taking? Authorizing Provider  ferrous sulfate  (SV IRON ) 325 (65 FE) MG tablet Take 1 tablet by mouth once daily 01/30/24   Dettinger, Fonda LABOR, MD  glimepiride  (AMARYL ) 4 MG tablet Take 1 tablet (4 mg total) by mouth daily before breakfast. 01/28/24   Dettinger, Fonda LABOR, MD  levothyroxine  (SYNTHROID ) 137 MCG tablet Take 1 tablet (137 mcg total) by mouth daily before breakfast. 02/05/24   Dettinger, Fonda LABOR, MD  lisinopril  (ZESTRIL ) 10 MG tablet Take 1 tablet (10 mg total) by mouth daily. 01/28/24   Dettinger, Fonda LABOR, MD  metFORMIN  (GLUCOPHAGE ) 1000 MG tablet Take 1 tablet (1,000 mg total) by mouth 2 (two) times daily with a meal. 01/28/24   Dettinger, Fonda LABOR, MD    Physical Exam: Vitals:  03/31/24 1152 03/31/24 1153  BP:  (!) 146/65  Pulse:  85  Resp:  20  Temp:  97.6 F (36.4 C)  TempSrc:  Axillary  SpO2:  98%  Weight: 87.5 kg   Height: 5' 10 (1.778 m)     Constitutional: NAD, calm, comfortable Vitals:   03/31/24 1152 03/31/24 1153  BP:  (!) 146/65  Pulse:  85  Resp:  20  Temp:  97.6 F (36.4 C)  TempSrc:  Axillary  SpO2:  98%  Weight: 87.5 kg   Height: 5' 10 (1.778 m)    Eyes: lids and conjunctivae normal Neck: normal, supple Respiratory: clear to auscultation bilaterally. Normal respiratory effort. No accessory muscle use.  Cardiovascular: Regular rate and rhythm, no  murmurs. Abdomen: no tenderness, no distention. Bowel sounds positive.  Musculoskeletal:  No edema. Skin: no rashes, lesions, ulcers.  Psychiatric: Flat affect  Labs on Admission: I have personally reviewed following labs and imaging studies  CBC: Recent Labs  Lab 03/31/24 1202  WBC 15.4*  NEUTROABS 13.9*  HGB 11.5*  HCT 33.8*  MCV 93.4  PLT 148*   Basic Metabolic Panel: Recent Labs  Lab 03/31/24 1202  NA 129*  K 5.7*  CL 98  CO2 10*  GLUCOSE 35*  BUN 102*  CREATININE 9.39*  CALCIUM 9.6   GFR: Estimated Creatinine Clearance: 7 mL/min (A) (by C-G formula based on SCr of 9.39 mg/dL (H)). Liver Function Tests: Recent Labs  Lab 03/31/24 1202  AST 37  ALT 23  ALKPHOS 53  BILITOT 1.1  PROT 7.6  ALBUMIN 3.8   No results for input(s): LIPASE, AMYLASE in the last 168 hours. No results for input(s): AMMONIA in the last 168 hours. Coagulation Profile: Recent Labs  Lab 03/31/24 1202  INR 1.1   Cardiac Enzymes: Recent Labs  Lab 03/31/24 1202  CKTOTAL 925*   BNP (last 3 results) No results for input(s): PROBNP in the last 8760 hours. HbA1C: No results for input(s): HGBA1C in the last 72 hours. CBG: Recent Labs  Lab 03/31/24 1152 03/31/24 1206 03/31/24 1317  GLUCAP 34* 165* 90   Lipid Profile: No results for input(s): CHOL, HDL, LDLCALC, TRIG, CHOLHDL, LDLDIRECT in the last 72 hours. Thyroid  Function Tests: No results for input(s): TSH, T4TOTAL, FREET4, T3FREE, THYROIDAB in the last 72 hours. Anemia Panel: No results for input(s): VITAMINB12, FOLATE, FERRITIN, TIBC, IRON , RETICCTPCT in the last 72 hours. Urine analysis: No results found for: COLORURINE, APPEARANCEUR, LABSPEC, PHURINE, GLUCOSEU, HGBUR, BILIRUBINUR, KETONESUR, PROTEINUR, UROBILINOGEN, NITRITE, LEUKOCYTESUR  Radiological Exams on Admission: No results found.  EKG: Independently reviewed.  SR, 86  bpm.  Assessment/Plan Principal Problem:   AKI (acute kidney injury) (HCC) Active Problems:   Type 2 diabetes mellitus with other specified complication (HCC)   Hypertension associated with diabetes (HCC)   Hypothyroidism   CKD (chronic kidney disease), stage III (HCC)    Severe AKI on CKD stage IIIa with hyperkalemia, metabolic acidosis, and hyponatremia -Baseline creatinine 1.4-1.8 - Continue aggressive IV fluid, likely prerenal - Hold home lisinopril  and NSAIDs - Lokelma  for hyperkalemia with plans to repeat BMP - Monitor metabolic acidosis - Check CT renal stone study - Urine electrolytes - Normal saline infusion - CK 925 -Monitor strict I's and O's - Appreciate nephrology evaluation  Hypoglycemia with history of type 2 diabetes - Hold home metformin  and glimepiride  - Given D50 in ED - Monitor closely and with SSI  Chronic anemia with mild thrombocytopenia - Check anemia panel - Hold heparin agents  History of hypertension - Appears to be on lisinopril  which has been held  Hypothyroidism - Continue levothyroxine   DVT prophylaxis: SCDs Code Status: Full Family Communication: Friend at bedside 7/30 Disposition Plan: Admit for IV fluid hydration and correction of electrolytes Consults called: Nephrology Admission status: Observation, telemetry  Severity of Illness: The appropriate patient status for this patient is OBSERVATION. Observation status is judged to be reasonable and necessary in order to provide the required intensity of service to ensure the patient's safety. The patient's presenting symptoms, physical exam findings, and initial radiographic and laboratory data in the context of their medical condition is felt to place them at decreased risk for further clinical deterioration. Furthermore, it is anticipated that the patient will be medically stable for discharge from the hospital within 2 midnights of admission.    Chee Dimon D Maree DO [facility]  If 7PM-7AM, please contact night-coverage [email or web address]  03/31/2024, 2:59 PM

## 2024-03-31 NOTE — ED Notes (Signed)
 Date and time results received: 03/31/24 1154   Test: POC CBG Critical Value: 6  Name of Provider Notified: Garrick Charleston, MD

## 2024-04-01 ENCOUNTER — Telehealth: Payer: Self-pay

## 2024-04-01 DIAGNOSIS — I152 Hypertension secondary to endocrine disorders: Secondary | ICD-10-CM | POA: Diagnosis present

## 2024-04-01 DIAGNOSIS — N179 Acute kidney failure, unspecified: Secondary | ICD-10-CM | POA: Diagnosis present

## 2024-04-01 DIAGNOSIS — E875 Hyperkalemia: Secondary | ICD-10-CM | POA: Diagnosis present

## 2024-04-01 DIAGNOSIS — Z833 Family history of diabetes mellitus: Secondary | ICD-10-CM | POA: Diagnosis not present

## 2024-04-01 DIAGNOSIS — E871 Hypo-osmolality and hyponatremia: Secondary | ICD-10-CM | POA: Diagnosis present

## 2024-04-01 DIAGNOSIS — M6282 Rhabdomyolysis: Secondary | ICD-10-CM | POA: Diagnosis present

## 2024-04-01 DIAGNOSIS — Z79899 Other long term (current) drug therapy: Secondary | ICD-10-CM | POA: Diagnosis not present

## 2024-04-01 DIAGNOSIS — E039 Hypothyroidism, unspecified: Secondary | ICD-10-CM | POA: Diagnosis present

## 2024-04-01 DIAGNOSIS — E872 Acidosis, unspecified: Secondary | ICD-10-CM | POA: Diagnosis present

## 2024-04-01 DIAGNOSIS — E11649 Type 2 diabetes mellitus with hypoglycemia without coma: Secondary | ICD-10-CM | POA: Diagnosis present

## 2024-04-01 DIAGNOSIS — D631 Anemia in chronic kidney disease: Secondary | ICD-10-CM | POA: Diagnosis present

## 2024-04-01 DIAGNOSIS — N1832 Chronic kidney disease, stage 3b: Secondary | ICD-10-CM | POA: Diagnosis present

## 2024-04-01 DIAGNOSIS — E861 Hypovolemia: Secondary | ICD-10-CM | POA: Diagnosis present

## 2024-04-01 DIAGNOSIS — E1122 Type 2 diabetes mellitus with diabetic chronic kidney disease: Secondary | ICD-10-CM | POA: Diagnosis present

## 2024-04-01 DIAGNOSIS — Z7984 Long term (current) use of oral hypoglycemic drugs: Secondary | ICD-10-CM | POA: Diagnosis not present

## 2024-04-01 DIAGNOSIS — E860 Dehydration: Secondary | ICD-10-CM | POA: Diagnosis present

## 2024-04-01 DIAGNOSIS — Z79890 Hormone replacement therapy (postmenopausal): Secondary | ICD-10-CM | POA: Diagnosis not present

## 2024-04-01 DIAGNOSIS — Z885 Allergy status to narcotic agent status: Secondary | ICD-10-CM | POA: Diagnosis not present

## 2024-04-01 DIAGNOSIS — N200 Calculus of kidney: Secondary | ICD-10-CM | POA: Diagnosis present

## 2024-04-01 DIAGNOSIS — D696 Thrombocytopenia, unspecified: Secondary | ICD-10-CM | POA: Diagnosis present

## 2024-04-01 LAB — GLUCOSE, CAPILLARY
Glucose-Capillary: 121 mg/dL — ABNORMAL HIGH (ref 70–99)
Glucose-Capillary: 126 mg/dL — ABNORMAL HIGH (ref 70–99)
Glucose-Capillary: 150 mg/dL — ABNORMAL HIGH (ref 70–99)
Glucose-Capillary: 157 mg/dL — ABNORMAL HIGH (ref 70–99)
Glucose-Capillary: 27 mg/dL — CL (ref 70–99)
Glucose-Capillary: 31 mg/dL — CL (ref 70–99)
Glucose-Capillary: 364 mg/dL — ABNORMAL HIGH (ref 70–99)
Glucose-Capillary: 415 mg/dL — ABNORMAL HIGH (ref 70–99)
Glucose-Capillary: 52 mg/dL — ABNORMAL LOW (ref 70–99)
Glucose-Capillary: 69 mg/dL — ABNORMAL LOW (ref 70–99)
Glucose-Capillary: 73 mg/dL (ref 70–99)
Glucose-Capillary: 82 mg/dL (ref 70–99)
Glucose-Capillary: 93 mg/dL (ref 70–99)

## 2024-04-01 LAB — URINALYSIS, ROUTINE W REFLEX MICROSCOPIC
Bilirubin Urine: NEGATIVE
Glucose, UA: >=500 mg/dL — AB
Ketones, ur: NEGATIVE mg/dL
Leukocytes,Ua: NEGATIVE
Nitrite: NEGATIVE
Protein, ur: 30 mg/dL — AB
Specific Gravity, Urine: 1.006 (ref 1.005–1.030)
pH: 5 (ref 5.0–8.0)

## 2024-04-01 LAB — BASIC METABOLIC PANEL WITH GFR
Anion gap: 12 (ref 5–15)
BUN: 93 mg/dL — ABNORMAL HIGH (ref 8–23)
CO2: 14 mmol/L — ABNORMAL LOW (ref 22–32)
Calcium: 8.1 mg/dL — ABNORMAL LOW (ref 8.9–10.3)
Chloride: 104 mmol/L (ref 98–111)
Creatinine, Ser: 7.43 mg/dL — ABNORMAL HIGH (ref 0.61–1.24)
GFR, Estimated: 7 mL/min — ABNORMAL LOW (ref >60)
Glucose, Bld: 34 mg/dL — CL (ref 70–99)
Potassium: 4.3 mmol/L (ref 3.5–5.1)
Sodium: 130 mmol/L — ABNORMAL LOW (ref 135–145)

## 2024-04-01 LAB — CBC
HCT: 28.6 % — ABNORMAL LOW (ref 39.0–52.0)
Hemoglobin: 9.7 g/dL — ABNORMAL LOW (ref 13.0–17.0)
MCH: 31.5 pg (ref 26.0–34.0)
MCHC: 33.9 g/dL (ref 30.0–36.0)
MCV: 92.9 fL (ref 80.0–100.0)
Platelets: 110 K/uL — ABNORMAL LOW (ref 150–400)
RBC: 3.08 MIL/uL — ABNORMAL LOW (ref 4.22–5.81)
RDW: 13.2 % (ref 11.5–15.5)
WBC: 11.5 K/uL — ABNORMAL HIGH (ref 4.0–10.5)
nRBC: 0 % (ref 0.0–0.2)

## 2024-04-01 LAB — CMP14+EGFR
ALT: 20 IU/L (ref 0–44)
AST: 27 IU/L (ref 0–40)
Albumin: 4 g/dL (ref 3.8–4.8)
Alkaline Phosphatase: 65 IU/L (ref 44–121)
BUN/Creatinine Ratio: 11 (ref 10–24)
BUN: 101 mg/dL (ref 8–27)
Bilirubin Total: 0.4 mg/dL (ref 0.0–1.2)
CO2: 9 mmol/L — AB (ref 20–29)
Calcium: 10.1 mg/dL (ref 8.6–10.2)
Chloride: 93 mmol/L — ABNORMAL LOW (ref 96–106)
Creatinine, Ser: 8.81 mg/dL — AB (ref 0.76–1.27)
Globulin, Total: 3.2 g/dL (ref 1.5–4.5)
Sodium: 132 mmol/L — ABNORMAL LOW (ref 134–144)
Total Protein: 7.2 g/dL (ref 6.0–8.5)
eGFR: 6 mL/min/1.73 — AB (ref >59)

## 2024-04-01 LAB — CBC WITH DIFFERENTIAL/PLATELET
Basophils Absolute: 0 x10E3/uL (ref 0.0–0.2)
Basos: 0 %
EOS (ABSOLUTE): 0 x10E3/uL (ref 0.0–0.4)
Eos: 0 %
Hematocrit: 37.1 % — ABNORMAL LOW (ref 37.5–51.0)
Hemoglobin: 11.7 g/dL — ABNORMAL LOW (ref 13.0–17.7)
Immature Grans (Abs): 0.1 x10E3/uL (ref 0.0–0.1)
Immature Granulocytes: 1 %
Lymphocytes Absolute: 0.6 x10E3/uL — ABNORMAL LOW (ref 0.7–3.1)
Lymphs: 4 %
MCH: 30.8 pg (ref 26.6–33.0)
MCHC: 31.5 g/dL (ref 31.5–35.7)
MCV: 98 fL — ABNORMAL HIGH (ref 79–97)
Monocytes Absolute: 0.7 x10E3/uL (ref 0.1–0.9)
Monocytes: 5 %
Neutrophils Absolute: 13.3 x10E3/uL — ABNORMAL HIGH (ref 1.4–7.0)
Neutrophils: 90 %
Platelets: 146 x10E3/uL — ABNORMAL LOW (ref 150–450)
RBC: 3.8 x10E6/uL — ABNORMAL LOW (ref 4.14–5.80)
RDW: 13.4 % (ref 11.6–15.4)
WBC: 14.7 x10E3/uL — ABNORMAL HIGH (ref 3.4–10.8)

## 2024-04-01 LAB — MAGNESIUM: Magnesium: 2.4 mg/dL (ref 1.7–2.4)

## 2024-04-01 LAB — CK TOTAL AND CKMB (NOT AT ARMC)
CK-MB Index: 28.1 ng/mL — AB (ref 0.0–10.4)
Total CK: 710 U/L — AB (ref 41–331)

## 2024-04-01 MED ORDER — INSULIN ASPART 100 UNIT/ML IJ SOLN: 3.0000 [IU] | Freq: Three times a day (TID) | INTRAMUSCULAR | Status: DC

## 2024-04-01 MED ORDER — DEXTROSE 10 % IV SOLN: INTRAVENOUS | Status: DC

## 2024-04-01 MED ORDER — STERILE WATER FOR INJECTION IV SOLN
INTRAVENOUS | Status: DC
  Filled 2024-04-01 (×5): qty 1000

## 2024-04-01 MED ORDER — SODIUM BICARBONATE 8.4 % IV SOLN
INTRAVENOUS | Status: DC
  Filled 2024-04-01 (×4): qty 1000

## 2024-04-01 MED ORDER — DEXTROSE 50 % IV SOLN
1.0000 | Freq: Once | INTRAVENOUS | Status: AC
  Administered 2024-04-01: 50 mL via INTRAVENOUS
  Filled 2024-04-01: qty 50

## 2024-04-01 MED ORDER — OCTREOTIDE ACETATE 50 MCG/ML IJ SOLN
50.0000 ug | Freq: Once | INTRAMUSCULAR | Status: AC
  Administered 2024-04-01: 50 ug via INTRAVENOUS
  Filled 2024-04-01: qty 1

## 2024-04-01 MED ORDER — INSULIN ASPART 100 UNIT/ML IJ SOLN
0.0000 [IU] | Freq: Three times a day (TID) | INTRAMUSCULAR | Status: DC
  Administered 2024-04-01: 15 [IU] via SUBCUTANEOUS
  Administered 2024-04-02: 8 [IU] via SUBCUTANEOUS
  Administered 2024-04-02 (×2): 3 [IU] via SUBCUTANEOUS
  Administered 2024-04-03: 5 [IU] via SUBCUTANEOUS

## 2024-04-01 MED ORDER — INSULIN ASPART 100 UNIT/ML IJ SOLN
0.0000 [IU] | Freq: Every day | INTRAMUSCULAR | Status: DC
  Administered 2024-04-02: 4 [IU] via SUBCUTANEOUS

## 2024-04-01 NOTE — Progress Notes (Signed)
 Hypoglycemic Event  CBG: 69  Treatment: 8 oz juice/soda  Symptoms: None  Follow-up CBG: Time:2137 CBG Result:121  Possible Reasons for Event: Unknown  Comments/MD notified: Opyd - MD notified. Pt drinking juice and eating pb crackers.    Andrea Mayer

## 2024-04-01 NOTE — Plan of Care (Signed)
  Problem: Education: Goal: Knowledge of General Education information will improve Description: Including pain rating scale, medication(s)/side effects and non-pharmacologic comfort measures Outcome: Progressing   Problem: Health Behavior/Discharge Planning: Goal: Ability to manage health-related needs will improve Outcome: Not Met (add Reason)   Problem: Clinical Measurements: Goal: Ability to maintain clinical measurements within normal limits will improve Outcome: Not Met (add Reason)   Problem: Activity: Goal: Risk for activity intolerance will decrease Outcome: Progressing   Problem: Nutrition: Goal: Adequate nutrition will be maintained Outcome: Not Met (add Reason)

## 2024-04-01 NOTE — Progress Notes (Signed)
 PROGRESS NOTE    Patrick [name]  FMW:[id number] DOB: [birth year] DOA: 03/31/2024 PCP: Dettinger, Fonda LABOR, MD   Brief Narrative:    Patrick [name] is a 78 y.o. male with medical history significant for type 2 diabetes, hypertension, CKD stage IIIa, and hypothyroidism who presented to the ED via EMS from urgent care with complaints of worsening weakness and fatigue.  Patient was admitted with AKI on CKD stage IIIb with noted hyperkalemia and metabolic acidosis.  Assessment & Plan:   Principal Problem:   AKI (acute kidney injury) (HCC) Active Problems:   Type 2 diabetes mellitus with other specified complication (HCC)   Hypertension associated with diabetes (HCC)   Hypothyroidism   CKD (chronic kidney disease), stage III (HCC)  Assessment and Plan:  Severe AKI on CKD stage IIIb with hyperkalemia, metabolic acidosis, and hyponatremia -Baseline creatinine 1.4-1.8, creatinine level slowly downtrending - Continue aggressive IV fluid, likely prerenal - Hold home lisinopril  and NSAIDs - Hyperkalemia resolved with Lokelma  - Monitor metabolic acidosis - CT renal stone study without hydronephrosis -Monitor strict I's and O's - Appreciate nephrology evaluation with plans to continue on D5 sodium bicarbonate  drip   Hypoglycemia with history of type 2 diabetes - Hold home metformin  and glimepiride  - Given D50 in ED and required D10 infusion overnight and will now remain on D5 with sodium bicarb  - Monitor closely and with SSI   Chronic anemia with mild thrombocytopenia - Anemia panel with low B12 noted, will start supplementation - Hold heparin agents   History of hypertension - Appears to be on lisinopril  which has been held   Hypothyroidism - Continue levothyroxine    DVT prophylaxis: SCDs Code Status: Full Family Communication: None at bedside Disposition Plan:  Status is: Inpatient Remains inpatient appropriate because: Continued need for IV fluid   Consultants:   Nephrology  Procedures:  None  Antimicrobials:  None   Subjective: Patient seen and evaluated today with overall improvement in symptoms, appears quite weak and was noted to have significant hypoglycemia overnight requiring D10 infusion.  Still remains somewhat hypoglycemic this a.m.  Objective: Vitals:   03/31/24 1924 04/01/24 0140 04/01/24 0300 04/01/24 1313  BP: (!) 156/71 (!) 151/64 131/66 (!) 169/89  Pulse: 98 86 81 87  Resp: 18 18 18    Temp: 98 F (36.7 C) 98.3 F (36.8 C) (!) 97.5 F (36.4 C) 98.1 F (36.7 C)  TempSrc: Oral  Oral Oral  SpO2: 99% 98% 99% 98%  Weight:      Height:        Intake/Output Summary (Last 24 hours) at 04/01/2024 1335 Last data filed at 04/01/2024 1313 Gross per 24 hour  Intake 4313.93 ml  Output --  Net 4313.93 ml   Filed Weights   03/31/24 1152  Weight: 87.5 kg    Examination:  General exam: Appears calm and comfortable  Respiratory system: Clear to auscultation. Respiratory effort normal. Cardiovascular system: S1 & S2 heard, RRR.  Gastrointestinal system: Abdomen is soft Central nervous system: Alert and awake Extremities: No edema Skin: No significant lesions noted Psychiatry: Flat affect.    Data Reviewed: I have personally reviewed following labs and imaging studies  CBC: Recent Labs  Lab 03/31/24 1000 03/31/24 1202 04/01/24 0403  WBC 14.7* 15.4* 11.5*  NEUTROABS 13.3* 13.9*  --   HGB 11.7* 11.5* 9.7*  HCT 37.1* 33.8* 28.6*  MCV 98* 93.4 92.9  PLT 146* 148* 110*   Basic Metabolic Panel: Recent Labs  Lab 03/31/24 1000 03/31/24 1202 03/31/24 1824  04/01/24 0403  NA 132* 129* 130* 130*  K CANCELED 5.7* 5.3* 4.3  CL 93* 98 101 104  CO2 9* 10* 10* 14*  GLUCOSE CANCELED 35* 97 34*  BUN 101* 102* 99* 93*  CREATININE 8.81* 9.39* 8.34* 7.43*  CALCIUM 10.1 9.6 8.7* 8.1*  MG  --   --   --  2.4   GFR: Estimated Creatinine Clearance: 8.9 mL/min (A) (by C-G formula based on SCr of 7.43 mg/dL (H)). Liver  Function Tests: Recent Labs  Lab 03/31/24 1000 03/31/24 1202  AST 27 37  ALT 20 23  ALKPHOS 65 53  BILITOT 0.4 1.1  PROT 7.2 7.6  ALBUMIN 4.0 3.8   No results for input(s): LIPASE, AMYLASE in the last 168 hours. No results for input(s): AMMONIA in the last 168 hours. Coagulation Profile: Recent Labs  Lab 03/31/24 1202  INR 1.1   Cardiac Enzymes: Recent Labs  Lab 03/31/24 1000 03/31/24 1202  CKTOTAL 710* 925*  CKMBINDEX 28.1*  --    BNP (last 3 results) No results for input(s): PROBNP in the last 8760 hours. HbA1C: Recent Labs    03/31/24 1202  HGBA1C 7.0*   CBG: Recent Labs  Lab 04/01/24 0405 04/01/24 0434 04/01/24 0632 04/01/24 0718 04/01/24 1110  GLUCAP 31* 150* 73 93 364*   Lipid Profile: No results for input(s): CHOL, HDL, LDLCALC, TRIG, CHOLHDL, LDLDIRECT in the last 72 hours. Thyroid  Function Tests: No results for input(s): TSH, T4TOTAL, FREET4, T3FREE, THYROIDAB in the last 72 hours. Anemia Panel: Recent Labs    03/31/24 1202  VITAMINB12 148*  FOLATE 3.8*  FERRITIN 97  TIBC 265  IRON  73  RETICCTPCT 1.3   Sepsis Labs: No results for input(s): PROCALCITON, LATICACIDVEN in the last 168 hours.  No results found for this or any previous visit (from the past 240 hours).       Radiology Studies: CT Renal Stone Study Result Date: 03/31/2024 CLINICAL DATA:  Abdominal and flank pain with stone suspected. Weakness. Generalized abdominal pain. Elevated creatinine. EXAM: CT ABDOMEN AND PELVIS WITHOUT CONTRAST TECHNIQUE: Multidetector CT imaging of the abdomen and pelvis was performed following the standard protocol without IV contrast. RADIATION DOSE REDUCTION: This exam was performed according to the departmental dose-optimization program which includes automated exposure control, adjustment of the mA and/or kV according to patient size and/or use of iterative reconstruction technique. COMPARISON:  Abdominal  ultrasound 11/09/2013 FINDINGS: Lower chest: Peripheral interstitial fibrosis with honeycomb changes suggesting usual interstitial pneumonitis. Distal esophageal wall thickening may indicate reflux disease or esophagitis. No distention of the visualized esophagus. Hepatobiliary: No focal liver abnormality is seen. No gallstones, gallbladder wall thickening, or biliary dilatation. Pancreas: Unremarkable. No pancreatic ductal dilatation or surrounding inflammatory changes. Spleen: Normal in size without focal abnormality. Adrenals/Urinary Tract: No adrenal gland nodules. 2 mm stone in the lower pole left kidney. No hydronephrosis or hydroureter. No ureteral stones or bladder stones. Mild bladder wall thickening could be due to cystitis or outlet obstruction. Correlate with urinalysis. Stomach/Bowel: Stomach, small bowel, and colon are not abnormally distended. Stool throughout the colon. No wall thickening or inflammatory stranding identified. Appendix is normal. Vascular/Lymphatic: Aortic atherosclerosis. No enlarged abdominal or pelvic lymph nodes. Reproductive: Prostate gland is enlarged measuring 6.5 cm in diameter. Other: No abdominal wall hernia or abnormality. No abdominopelvic ascites. Musculoskeletal: No acute or significant osseous findings. IMPRESSION: 1. Nonobstructing intrarenal stone on the left. 2. No hydronephrosis or hydroureter. No ureteral stone or obstruction. 3. Bladder wall thickening may be due  to cystitis or outlet obstruction. Correlate with urinalysis. 4. Prostate gland is enlarged. 5. Interstitial fibrosis and honeycomb changes in the periphery of the lung bases suggesting usual interstitial pneumonitis. Electronically Signed   By: Elsie Gravely M.D.   On: 03/31/2024 16:58        Scheduled Meds:  insulin  aspart  0-6 Units Subcutaneous Q4H   levothyroxine   137 mcg Oral QAC breakfast   Continuous Infusions:  sodium bicarbonate  150 mEq in dextrose  5 % 1,150 mL infusion 125 mL/hr  at 04/01/24 1058     LOS: 0 days    Time spent: 55 minutes    Goldie Dimmer JONETTA Fairly, DO [facility]  If 7PM-7AM, please contact night-coverage [email or web address] 04/01/2024, 1:35 PM

## 2024-04-01 NOTE — Consult Note (Addendum)
 [name] Nephrology Consultation Note  Requesting MD: Dr. Maree, Pratik Reason for consult: AKI on CKD  HPI:  Patrick [name] is a 79 y.o. male with past medical history significant for hypertension, diabetes, hypothyroidism, CKD stage III with baseline creatinine level seems to be around 1.4-1.8 who was presented with generalized weakness, dizziness, fatigue and severe dehydration, seen as a consultation for the evaluation of acute kidney injury on CKD. The patient said he was working out in the hot weather for the last few days.  He was not drinking enough fluid to maintain hydration.  It seems like he was continuing taking his medication including oral hypoglycemic agent, lisinopril  and also taking NSAIDs intermittently for pain management.  He has baseline CKD stage IIIa which seems to be due to diabetes or hypertension.  Patient said he was not seen by nephrologist in the past. In the ER, he was afebrile, in room air, blood pressure mostly in acceptable range.  The labs showed sodium 132, potassium 5.7, CO2 9, BUN 101, creatinine level peaked at 9.39, anion gap 21 CPK 925, leukocytosis, mildly anemic.  CT scan showed a nonobstructing left kidney stone, no hydronephrosis or hydroureter noted.  Prostate gland was enlarged. He was treated with IV fluid bolus and admitted for further evaluation. Overnight he has multiple episodes of hypoglycemia therefore received octreotide .  He is currently on D5W maintenance fluid.  Also required antiemetics for nausea/vomiting.  Patient said he has been going to bathroom every hour however the urine output is not documented.  He feels somewhat better today.  The lab today showed improvement of potassium level to 4.3, BUN and creatinine level trending down nicely. He denies chest pain, shortness of breath.  PMHx:   Past Medical History:  Diagnosis Date   Diabetes mellitus without complication (HCC)    Hypertension    Thyroid  disease      Past Surgical History:  Procedure Laterality Date   None      Family Hx:  Family History  Problem Relation Age of Onset   Diabetes Mother    Diabetes Brother    Colon cancer Neg Hx    Liver disease Neg Hx     Social History:  reports that he has never smoked. He has never been exposed to tobacco smoke. He has never used smokeless tobacco. He reports that he does not drink alcohol and does not use drugs.  Allergies:  Allergies  Allergen Reactions   Codeine Other (See Comments)    Excessive sweating, lightheadedness. Led to a hospitalization.    Medications: Prior to Admission medications   Medication Sig Start Date End Date Taking? Authorizing Provider  Dihydroxyaluminum Sod Carb (ROLAIDS PO) Take 1 tablet by mouth 2 (two) times daily as needed (heartburn).   Yes [provider]  ferrous sulfate  (SV IRON ) 325 (65 FE) MG tablet Take 1 tablet by mouth once daily 01/30/24  Yes Dettinger, Fonda LABOR, MD  glimepiride  (AMARYL ) 4 MG tablet Take 1 tablet (4 mg total) by mouth daily before breakfast. 01/28/24  Yes Dettinger, Fonda LABOR, MD  ibuprofen (ADVIL) 200 MG tablet Take 400 mg by mouth in the morning and at bedtime.   Yes [provider]  levothyroxine  (SYNTHROID ) 137 MCG tablet Take 1 tablet (137 mcg total) by mouth daily before breakfast. 02/05/24  Yes Dettinger, Fonda LABOR, MD  lisinopril  (ZESTRIL ) 10 MG tablet Take 1 tablet (10 mg total) by mouth daily. 01/28/24  Yes Dettinger, Fonda LABOR, MD  metFORMIN  (GLUCOPHAGE )  1000 MG tablet Take 1 tablet (1,000 mg total) by mouth 2 (two) times daily with a meal. 01/28/24  Yes Dettinger, Fonda LABOR, MD  naproxen sodium (ALEVE) 220 MG tablet Take 220-440 mg by mouth daily as needed (pain).   Yes [provider]    I have reviewed the patient's current medications.  Labs: Renal Panel: Recent Labs  Lab 03/31/24 1000 03/31/24 1202 03/31/24 1824 04/01/24 0403  NA 132* 129* 130* 130*  K CANCELED 5.7* 5.3* 4.3  CL 93*  98 101 104  CO2 9* 10* 10* 14*  GLUCOSE CANCELED 35* 97 34*  BUN 101* 102* 99* 93*  CREATININE 8.81* 9.39* 8.34* 7.43*  CALCIUM 10.1 9.6 8.7* 8.1*  MG  --   --   --  2.4     CBC:    Latest Ref Rng & Units 04/01/2024    4:03 AM 03/31/2024   12:02 PM 03/31/2024   10:00 AM  CBC  WBC 4.0 - 10.5 K/uL 11.5  15.4  14.7   Hemoglobin 13.0 - 17.0 g/dL 9.7  88.4  88.2   Hematocrit 39.0 - 52.0 % 28.6  33.8  37.1   Platelets 150 - 400 K/uL 110  148  146      Anemia Panel:  Recent Labs    10/03/23 0934 01/28/24 1322 03/31/24 1000 03/31/24 1202 04/01/24 0403  HGB 11.5* 11.6* 11.7* 11.5* 9.7*  MCV 97 96 98* 93.4 92.9  VITAMINB12  --   --   --  148*  --   FOLATE  --   --   --  3.8*  --   FERRITIN  --   --   --  97  --   TIBC  --   --   --  265  --   IRON   --   --   --  73  --   RETICCTPCT  --   --   --  1.3  --     Recent Labs  Lab 03/31/24 1000 03/31/24 1202  AST 27 37  ALT 20 23  ALKPHOS 65 53  BILITOT 0.4 1.1  PROT 7.2 7.6  ALBUMIN 4.0 3.8    Lab Results  Component Value Date   HGBA1C 7.0 (H) 03/31/2024    ROS:  Pertinent items noted in HPI and remainder of comprehensive ROS otherwise negative.  Physical Exam: Vitals:   04/01/24 0140 04/01/24 0300  BP: (!) 151/64 131/66  Pulse: 86 81  Resp: 18 18  Temp: 98.3 F (36.8 C) (!) 97.5 F (36.4 C)  SpO2: 98% 99%     General exam: Appears calm and comfortable  Respiratory system: Clear to auscultation. Respiratory effort normal. No wheezing or crackle Cardiovascular system: S1 & S2 heard, RRR.  No pedal edema. Gastrointestinal system: Abdomen is nondistended, soft and nontender. Normal bowel sounds heard. Central nervous system: Alert and oriented. No focal neurological deficits. Extremities: Symmetric 5 x 5 power. Skin: No rashes, lesions or ulcers Psychiatry: Judgement and insight appear normal. Mood & affect appropriate.   Assessment/Plan:  # Acute kidney injury on CKD stage IIIb (b/l cr 1.4-1.8) likely  prolonged prerenal azotemia caused by severe dehydration and concomitant use of lisinopril , NSAIDs and mild rhabdomyolysis.  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  I will check UA.  Renal labs already improving with IV fluid.  Change fluid to sodium bicarbonate  to help with acidosis.  No overt signs or symptoms of uremia. Expect renal  recovery with current management and no need for dialysis.  Please continue to avoid ACE inhibitor, ARB, NSAIDs and IV contrast unless absolutely indicated. Recommend strict ins and out and daily lab.  # Hyperkalemia: Improved with IV fluid and medical management.  # Anion gap metabolic acidosis due to renal failure: Adding sodium bicarbonate , follow serum CO2 level.  # Hyponatremia, hypovolemic: Urine lites reviewed.  Continue IV fluid and monitor lab.  Currently on hypotonic fluid which will be changed to isotonic fluid.  # Hypoglycemia presumed due to oral hypoglycemic agent with low GFR.  Treating with dextrose .  Thank you for the consult.  We will continue to follow.  Discussed with the primary team.   Avary Pitsenbarger Amelie Romney 04/01/2024, 8:19 AM  [facility].

## 2024-04-01 NOTE — Progress Notes (Addendum)
 Hypoglycemic Event  CBG: 31  Treatment: D50 50 mL  Symptoms: None  Follow-up CBG: Time: 0444 CBG Result: 150  Possible Reasons for Event: unknown  Comments/MD notified: Administer another ampule of D50; pt drinking juice and eating crackers; IVF changed to D10%  Patrick [name] L Bethania Schlotzhauer

## 2024-04-01 NOTE — Telephone Encounter (Signed)
 Okay thanks, it looks like the patient went to the ER already which is where he needs to be.

## 2024-04-01 NOTE — Plan of Care (Signed)
  Problem: Health Behavior/Discharge Planning: Goal: Ability to manage health-related needs will improve Outcome: Not Met (add Reason)   Problem: Clinical Measurements: Goal: Ability to maintain clinical measurements within normal limits will improve Outcome: Not Met (add Reason) Goal: Diagnostic test results will improve Outcome: Not Met (add Reason)   Problem: Elimination: Goal: Will not experience complications related to bowel motility Outcome: Progressing Goal: Will not experience complications related to urinary retention Outcome: Progressing   Problem: Pain Managment: Goal: General experience of comfort will improve and/or be controlled Outcome: Progressing

## 2024-04-01 NOTE — Progress Notes (Signed)
 Pt is q4 CBG. Pt with blood glucose of 27. Asymptomatic; alert, oriented, ambulated to bathroom with nurse and nurse tech. Administered 1 ampule of D50 and notified provider. Pt currently has D5NS running at 100 mL/hr. Will continue to monitor

## 2024-04-01 NOTE — Progress Notes (Signed)
   04/01/24 1151  TOC Brief Assessment  Insurance and Status Reviewed  Patient has primary care physician Yes  Home environment has been reviewed Single family home  Prior level of function: Independent  Prior/Current Home Services No current home services  Social Drivers of Health Review SDOH reviewed no interventions necessary  Readmission risk has been reviewed Yes  Transition of care needs no transition of care needs at this time

## 2024-04-01 NOTE — Progress Notes (Signed)
 Hypoglycemic Event  CBG: 27  Treatment: D50 50 mL (25 gm)  Symptoms: None  Follow-up CBG: Time:0100 CBG Result:126  Possible Reasons for Event: Inadequate meal intake. Pt did not tolerate dinner.   Comments/MD notified: Press photographer and MD - Opyd notified. Pt stated he did not like hospital food but will have a friend bring him breakfast in the morning. Pt was able to eat crackers and drink 8 oz of orange juice. D5 NS currently infusing. Blood sugars checks q4hrs. Will continue to monitor for changes.    Andrea Mayer

## 2024-04-01 NOTE — Telephone Encounter (Signed)
 Misha calling from [facility] with critical lab results. BUN 101  Creatinine 8.81  Carbon Dioxide total 9  Creatine Kinace Total 710  Creatine Kinace CK, MB 28.1

## 2024-04-01 NOTE — Progress Notes (Signed)
 7/31 Critical Lab Result rec'd @ 0524 - Glucose 34  Evalene GORMAN Sprinkles, MD notified @ [phone]. Provider response @ 0540. New order placed for Dextrose  10%, Sandostatin  Inject 50 mcg.  Pt requested pb crackers and juice. Did not tolerate well, nausea and vomiting. Re-checked pt BS, currently at 76. Will continue to monitor for changes.

## 2024-04-02 ENCOUNTER — Ambulatory Visit: Payer: Self-pay | Admitting: Family Medicine

## 2024-04-02 DIAGNOSIS — N179 Acute kidney failure, unspecified: Secondary | ICD-10-CM | POA: Diagnosis not present

## 2024-04-02 LAB — GLUCOSE, CAPILLARY
Glucose-Capillary: 144 mg/dL — ABNORMAL HIGH (ref 70–99)
Glucose-Capillary: 154 mg/dL — ABNORMAL HIGH (ref 70–99)
Glucose-Capillary: 164 mg/dL — ABNORMAL HIGH (ref 70–99)
Glucose-Capillary: 177 mg/dL — ABNORMAL HIGH (ref 70–99)
Glucose-Capillary: 182 mg/dL — ABNORMAL HIGH (ref 70–99)
Glucose-Capillary: 296 mg/dL — ABNORMAL HIGH (ref 70–99)
Glucose-Capillary: 307 mg/dL — ABNORMAL HIGH (ref 70–99)

## 2024-04-02 LAB — RENAL FUNCTION PANEL
Albumin: 3 g/dL — ABNORMAL LOW (ref 3.5–5.0)
Anion gap: 13 (ref 5–15)
BUN: 74 mg/dL — ABNORMAL HIGH (ref 8–23)
CO2: 24 mmol/L (ref 22–32)
Calcium: 8.1 mg/dL — ABNORMAL LOW (ref 8.9–10.3)
Chloride: 99 mmol/L (ref 98–111)
Creatinine, Ser: 4.87 mg/dL — ABNORMAL HIGH (ref 0.61–1.24)
GFR, Estimated: 12 mL/min — ABNORMAL LOW (ref >60)
Glucose, Bld: 167 mg/dL — ABNORMAL HIGH (ref 70–99)
Phosphorus: 4 mg/dL (ref 2.5–4.6)
Potassium: 4.3 mmol/L (ref 3.5–5.1)
Sodium: 136 mmol/L (ref 135–145)

## 2024-04-02 LAB — CBC
HCT: 29.4 % — ABNORMAL LOW (ref 39.0–52.0)
Hemoglobin: 10.1 g/dL — ABNORMAL LOW (ref 13.0–17.0)
MCH: 31.6 pg (ref 26.0–34.0)
MCHC: 34.4 g/dL (ref 30.0–36.0)
MCV: 91.9 fL (ref 80.0–100.0)
Platelets: 126 K/uL — ABNORMAL LOW (ref 150–400)
RBC: 3.2 MIL/uL — ABNORMAL LOW (ref 4.22–5.81)
RDW: 13.1 % (ref 11.5–15.5)
WBC: 7.5 K/uL (ref 4.0–10.5)
nRBC: 0 % (ref 0.0–0.2)

## 2024-04-02 LAB — MAGNESIUM: Magnesium: 2.1 mg/dL (ref 1.7–2.4)

## 2024-04-02 MED ORDER — HYDRALAZINE HCL 20 MG/ML IJ SOLN: 10.0000 mg | INTRAMUSCULAR | Status: DC | PRN

## 2024-04-02 NOTE — Plan of Care (Signed)

## 2024-04-02 NOTE — Progress Notes (Addendum)
 Pt exp one episode of hypoglycemia. Charge nurse and MD notified. Tx: juice and crackers were given. Pt also exp several episodes urination last night and morning, at least every thirty to 45 minutes. V/S are within range and pt continues to be asymptomatic. 0500 BS 182, held one unit of correction dose. Pt declined, felt the 15 units of coverage he received yesterday is the reason for his insulin  levels dropping. Will continue to monitor.

## 2024-04-02 NOTE — Care Management Important Message (Signed)
 Important Message  Patient Details  Name: Patrick [name] MRN: [id number] Date of Birth: [birth year]   Important Message Given:  Yes - Medicare IM     [name] [name] 04/02/2024, 2:14 PM

## 2024-04-02 NOTE — Progress Notes (Signed)
 OT Cancellation Note  Patient Details Name: Patrick [name] MRN: [id number] DOB: Feb 06, 1946   Cancelled Treatment:    Reason Eval/Treat Not Completed: OT screened, no needs identified, will sign off. Pt reports ambulating to the bathroom without assist. Pt has not ADL concerns and WFL UE strength/A/ROM. Mild balance issues during ambulation but pt is not interested in any therapy. Pt will be removed from the OT list.   JAYSON PERSON OT, MOT   JAYSON PERSON 04/02/2024, 10:49 AM

## 2024-04-02 NOTE — Progress Notes (Signed)
 PROGRESS NOTE    Patrick [name]  FMW:[id number] DOB: July 30, 1946 DOA: 03/31/2024 PCP: Dettinger, Fonda LABOR, MD   Brief Narrative:    Patrick [name] is a 78 y.o. male with medical history significant for type 2 diabetes, hypertension, CKD stage IIIa, and hypothyroidism who presented to the ED via EMS from urgent care with complaints of worsening weakness and fatigue.  Patient was admitted with AKI on CKD stage IIIb with noted hyperkalemia and metabolic acidosis.  Assessment & Plan:   Principal Problem:   AKI (acute kidney injury) (HCC) Active Problems:   Type 2 diabetes mellitus with other specified complication (HCC)   Hypertension associated with diabetes (HCC)   Hypothyroidism   CKD (chronic kidney disease), stage III (HCC)  Assessment and Plan:  Severe AKI on CKD stage IIIb with hyperkalemia, metabolic acidosis, and hyponatremia-improving -Baseline creatinine 1.4-1.8, creatinine level slowly downtrending - Hold further IV fluid and continue to monitor trend in the a.m., if stable can anticipate discharge at that time - Hold home lisinopril  and NSAIDs - Hyperkalemia resolved with Lokelma  - Metabolic acidosis resolved - CT renal stone study without hydronephrosis -Monitor strict I's and O's - Appreciate nephrology recommendations, signed off for now -PT/OT evaluation today   Hypoglycemia with history of type 2 diabetes, now hyperglycemic - Hold home metformin  and glimepiride  - Monitor carefully with SSI and mealtime insulin    Chronic anemia with mild thrombocytopenia-stable - Anemia panel with low B12 noted, will start supplementation - Hold heparin agents   History of hypertension - Appears to be on lisinopril  which has been held -Will add IV hydralazine now with elevated blood pressure readings   Hypothyroidism - Continue levothyroxine    DVT prophylaxis: SCDs Code Status: Full Family Communication: None at bedside Disposition Plan:  Status is:  Inpatient Remains inpatient appropriate because: Continued need for IV fluid   Consultants:  Nephrology  Procedures:  None  Antimicrobials:  None   Subjective: Patient seen and evaluated today and is overall feeling well and tolerating diet.  He is eager for discharge, but creatinine is still quite elevated.  Appears to be weak with ambulation and requires PT evaluation.  Objective: Vitals:   04/01/24 1313 04/01/24 1925 04/02/24 0411 04/02/24 0425  BP: (!) 169/89 131/71 (!) 173/80 (!) 173/80  Pulse: 87 85 76 75  Resp:  17 16 16   Temp: 98.1 F (36.7 C) 97.8 F (36.6 C) 98.3 F (36.8 C) 97.8 F (36.6 C)  TempSrc: Oral Oral  Oral  SpO2: 98% 99% 99% 98%  Weight:      Height:        Intake/Output Summary (Last 24 hours) at 04/02/2024 1158 Last data filed at 04/02/2024 9191 Gross per 24 hour  Intake 1312.46 ml  Output 2175 ml  Net -862.54 ml   Filed Weights   03/31/24 1152  Weight: 87.5 kg    Examination:  General exam: Appears calm and comfortable  Respiratory system: Clear to auscultation. Respiratory effort normal. Cardiovascular system: S1 & S2 heard, RRR.  Gastrointestinal system: Abdomen is soft Central nervous system: Alert and awake Extremities: No edema Skin: No significant lesions noted Psychiatry: Flat affect.    Data Reviewed: I have personally reviewed following labs and imaging studies  CBC: Recent Labs  Lab 03/31/24 1000 03/31/24 1202 04/01/24 0403 04/02/24 0403  WBC 14.7* 15.4* 11.5* 7.5  NEUTROABS 13.3* 13.9*  --   --   HGB 11.7* 11.5* 9.7* 10.1*  HCT 37.1* 33.8* 28.6* 29.4*  MCV 98* 93.4 92.9 91.9  PLT 146* 148* 110* 126*   Basic Metabolic Panel: Recent Labs  Lab 03/31/24 1000 03/31/24 1202 03/31/24 1824 04/01/24 0403 04/02/24 0403  NA 132* 129* 130* 130* 136  K CANCELED 5.7* 5.3* 4.3 4.3  CL 93* 98 101 104 99  CO2 9* 10* 10* 14* 24  GLUCOSE CANCELED 35* 97 34* 167*  BUN 101* 102* 99* 93* 74*  CREATININE 8.81* 9.39*  8.34* 7.43* 4.87*  CALCIUM 10.1 9.6 8.7* 8.1* 8.1*  MG  --   --   --  2.4 2.1  PHOS  --   --   --   --  4.0   GFR: Estimated Creatinine Clearance: 13.5 mL/min (A) (by C-G formula based on SCr of 4.87 mg/dL (H)). Liver Function Tests: Recent Labs  Lab 03/31/24 1000 03/31/24 1202 04/02/24 0403  AST 27 37  --   ALT 20 23  --   ALKPHOS 65 53  --   BILITOT 0.4 1.1  --   PROT 7.2 7.6  --   ALBUMIN 4.0 3.8 3.0*   No results for input(s): LIPASE, AMYLASE in the last 168 hours. No results for input(s): AMMONIA in the last 168 hours. Coagulation Profile: Recent Labs  Lab 03/31/24 1202  INR 1.1   Cardiac Enzymes: Recent Labs  Lab 03/31/24 1000 03/31/24 1202  CKTOTAL 710* 925*  CKMBINDEX 28.1*  --    BNP (last 3 results) No results for input(s): PROBNP in the last 8760 hours. HbA1C: Recent Labs    03/31/24 1202  HGBA1C 7.0*   CBG: Recent Labs  Lab 04/02/24 0015 04/02/24 0102 04/02/24 0349 04/02/24 0736 04/02/24 1119  GLUCAP 154* 144* 182* 177* 296*   Lipid Profile: No results for input(s): CHOL, HDL, LDLCALC, TRIG, CHOLHDL, LDLDIRECT in the last 72 hours. Thyroid  Function Tests: No results for input(s): TSH, T4TOTAL, FREET4, T3FREE, THYROIDAB in the last 72 hours. Anemia Panel: Recent Labs    03/31/24 1202  VITAMINB12 148*  FOLATE 3.8*  FERRITIN 97  TIBC 265  IRON  73  RETICCTPCT 1.3   Sepsis Labs: No results for input(s): PROCALCITON, LATICACIDVEN in the last 168 hours.  No results found for this or any previous visit (from the past 240 hours).       Radiology Studies: CT Renal Stone Study Result Date: 03/31/2024 CLINICAL DATA:  Abdominal and flank pain with stone suspected. Weakness. Generalized abdominal pain. Elevated creatinine. EXAM: CT ABDOMEN AND PELVIS WITHOUT CONTRAST TECHNIQUE: Multidetector CT imaging of the abdomen and pelvis was performed following the standard protocol without IV contrast. RADIATION  DOSE REDUCTION: This exam was performed according to the departmental dose-optimization program which includes automated exposure control, adjustment of the mA and/or kV according to patient size and/or use of iterative reconstruction technique. COMPARISON:  Abdominal ultrasound 11/09/2013 FINDINGS: Lower chest: Peripheral interstitial fibrosis with honeycomb changes suggesting usual interstitial pneumonitis. Distal esophageal wall thickening may indicate reflux disease or esophagitis. No distention of the visualized esophagus. Hepatobiliary: No focal liver abnormality is seen. No gallstones, gallbladder wall thickening, or biliary dilatation. Pancreas: Unremarkable. No pancreatic ductal dilatation or surrounding inflammatory changes. Spleen: Normal in size without focal abnormality. Adrenals/Urinary Tract: No adrenal gland nodules. 2 mm stone in the lower pole left kidney. No hydronephrosis or hydroureter. No ureteral stones or bladder stones. Mild bladder wall thickening could be due to cystitis or outlet obstruction. Correlate with urinalysis. Stomach/Bowel: Stomach, small bowel, and colon are not abnormally distended. Stool throughout the colon. No wall thickening or inflammatory stranding identified. Appendix  is normal. Vascular/Lymphatic: Aortic atherosclerosis. No enlarged abdominal or pelvic lymph nodes. Reproductive: Prostate gland is enlarged measuring 6.5 cm in diameter. Other: No abdominal wall hernia or abnormality. No abdominopelvic ascites. Musculoskeletal: No acute or significant osseous findings. IMPRESSION: 1. Nonobstructing intrarenal stone on the left. 2. No hydronephrosis or hydroureter. No ureteral stone or obstruction. 3. Bladder wall thickening may be due to cystitis or outlet obstruction. Correlate with urinalysis. 4. Prostate gland is enlarged. 5. Interstitial fibrosis and honeycomb changes in the periphery of the lung bases suggesting usual interstitial pneumonitis. Electronically Signed    By: Elsie Gravely M.D.   On: 03/31/2024 16:58        Scheduled Meds:  insulin  aspart  0-15 Units Subcutaneous TID WC   insulin  aspart  0-5 Units Subcutaneous QHS   insulin  aspart  3 Units Subcutaneous TID WC   levothyroxine   137 mcg Oral QAC breakfast     LOS: 1 day    Time spent: 55 minutes    Lewin Pellow JONETTA Fairly, DO [facility]  If 7PM-7AM, please contact night-coverage [email or web address] 04/02/2024, 11:58 AM

## 2024-04-02 NOTE — Plan of Care (Signed)
  Problem: Health Behavior/Discharge Planning: Goal: Ability to manage health-related needs will improve Outcome: Progressing   Problem: Clinical Measurements: Goal: Will remain free from infection Outcome: Progressing   Problem: Activity: Goal: Risk for activity intolerance will decrease Outcome: Progressing   Problem: Coping: Goal: Level of anxiety will decrease Outcome: Progressing   Problem: Elimination: Goal: Will not experience complications related to bowel motility Outcome: Progressing Goal: Will not experience complications related to urinary retention Outcome: Progressing   Problem: Pain Managment: Goal: General experience of comfort will improve and/or be controlled Outcome: Progressing   Problem: Safety: Goal: Ability to remain free from injury will improve Outcome: Progressing   Problem: Metabolic: Goal: Ability to maintain appropriate glucose levels will improve Outcome: Progressing   Problem: Nutritional: Goal: Progress toward achieving an optimal weight will improve Outcome: Progressing

## 2024-04-02 NOTE — Progress Notes (Addendum)
 [facility] NEPHROLOGY PROGRESS NOTE  Assessment/ Plan: Pt is a 78 y.o. yo male  with past medical history significant for hypertension, diabetes, hypothyroidism, CKD stage III with baseline creatinine level seems to be around 1.4-1.8 who was presented with generalized weakness, dizziness, fatigue and severe dehydration, seen as a consultation for the evaluation of acute kidney injury on CKD.   # Acute kidney injury on CKD stage IIIb (b/l cr 1.4-1.8) likely prolonged prerenal azotemia caused by severe dehydration and concomitant use of lisinopril , NSAIDs and mild rhabdomyolysis.  CKD presumed due to diabetic nephropathy. CT scan ruled out hydronephrosis. UA unremarkable.  Increased urine output and creatinine level significantly improved with IV hydration.  Patient is having renal recovery.  He is clinically improved and able to tolerate p.o.  I will discontinue IV fluid.  Repeat lab in the morning.  If renal function continue to improve then he can be discharged home from renal perspective.  Recommend to check BMP in a week with PCP.  He lives in [place].  PCP can refer to nephrology based on the lab.   # Hyperkalemia: Improved with IV fluid and medical management.  Hold ACE inhibitor or ARB, can resume as outpatient.   # Anion gap metabolic acidosis due to renal failure: Improved.  Discontinue sodium bicarbonate .   # Hyponatremia, hypovolemic: Urine lites reviewed.  Improved with IV hydration.   # Hypoglycemia presumed due to oral hypoglycemic agent with low GFR.  Treating with dextrose .  Sign off, please call us  back with question.  Subjective: Seen and examined at bedside.  Patient reports feeling much better.  Denies nausea, vomiting, chest pain or shortness of breath.  Urine output is around 2 L.  No event overnight.  He is eager to go home. Objective Vital signs in last 24 hours: Vitals:   04/01/24 0300 04/01/24 1313 04/01/24 1925 04/02/24 0425  BP: 131/66 (!) 169/89  131/71 (!) 173/80  Pulse: 81 87 85 75  Resp: 18  17 16   Temp: (!) 97.5 F (36.4 C) 98.1 F (36.7 C) 97.8 F (36.6 C) 97.8 F (36.6 C)  TempSrc: Oral Oral Oral Oral  SpO2: 99% 98% 99% 98%  Weight:      Height:       Weight change:   Intake/Output Summary (Last 24 hours) at 04/02/2024 1106 Last data filed at 04/02/2024 9191 Gross per 24 hour  Intake 1312.46 ml  Output 2175 ml  Net -862.54 ml       Labs: RENAL PANEL Recent Labs  Lab 03/31/24 1000 03/31/24 1202 03/31/24 1824 04/01/24 0403 04/02/24 0403  NA 132* 129* 130* 130* 136  K CANCELED 5.7* 5.3* 4.3 4.3  CL 93* 98 101 104 99  CO2 9* 10* 10* 14* 24  GLUCOSE CANCELED 35* 97 34* 167*  BUN 101* 102* 99* 93* 74*  CREATININE 8.81* 9.39* 8.34* 7.43* 4.87*  CALCIUM 10.1 9.6 8.7* 8.1* 8.1*  MG  --   --   --  2.4 2.1  PHOS  --   --   --   --  4.0  ALBUMIN 4.0 3.8  --   --  3.0*    Liver Function Tests: Recent Labs  Lab 03/31/24 1000 03/31/24 1202 04/02/24 0403  AST 27 37  --   ALT 20 23  --   ALKPHOS 65 53  --   BILITOT 0.4 1.1  --   PROT 7.2 7.6  --   ALBUMIN 4.0 3.8 3.0*   No results  for input(s): LIPASE, AMYLASE in the last 168 hours. No results for input(s): AMMONIA in the last 168 hours. CBC: Recent Labs    01/28/24 1322 03/31/24 1000 03/31/24 1202 03/31/24 1202 04/01/24 0403 04/02/24 0403  HGB 11.6* 11.7* 11.5*  --  9.7* 10.1*  MCV 96 98* 93.4   < > 92.9 91.9  VITAMINB12  --   --  148*  --   --   --   FOLATE  --   --  3.8*  --   --   --   FERRITIN  --   --  97  --   --   --   TIBC  --   --  265  --   --   --   IRON   --   --  73  --   --   --   RETICCTPCT  --   --  1.3  --   --   --    < > = values in this interval not displayed.    Cardiac Enzymes: Recent Labs  Lab 03/31/24 1000 03/31/24 1202  CKTOTAL 710* 925*  CKMBINDEX 28.1*  --    CBG: Recent Labs  Lab 04/01/24 2228 04/02/24 0015 04/02/24 0102 04/02/24 0349 04/02/24 0736  GLUCAP 157* 154* 144* 182* 177*    Iron   Studies:  Recent Labs    03/31/24 1202  IRON  73  TIBC 265  FERRITIN 97   Studies/Results: CT Renal Stone Study Result Date: 03/31/2024 CLINICAL DATA:  Abdominal and flank pain with stone suspected. Weakness. Generalized abdominal pain. Elevated creatinine. EXAM: CT ABDOMEN AND PELVIS WITHOUT CONTRAST TECHNIQUE: Multidetector CT imaging of the abdomen and pelvis was performed following the standard protocol without IV contrast. RADIATION DOSE REDUCTION: This exam was performed according to the departmental dose-optimization program which includes automated exposure control, adjustment of the mA and/or kV according to patient size and/or use of iterative reconstruction technique. COMPARISON:  Abdominal ultrasound 11/09/2013 FINDINGS: Lower chest: Peripheral interstitial fibrosis with honeycomb changes suggesting usual interstitial pneumonitis. Distal esophageal wall thickening may indicate reflux disease or esophagitis. No distention of the visualized esophagus. Hepatobiliary: No focal liver abnormality is seen. No gallstones, gallbladder wall thickening, or biliary dilatation. Pancreas: Unremarkable. No pancreatic ductal dilatation or surrounding inflammatory changes. Spleen: Normal in size without focal abnormality. Adrenals/Urinary Tract: No adrenal gland nodules. 2 mm stone in the lower pole left kidney. No hydronephrosis or hydroureter. No ureteral stones or bladder stones. Mild bladder wall thickening could be due to cystitis or outlet obstruction. Correlate with urinalysis. Stomach/Bowel: Stomach, small bowel, and colon are not abnormally distended. Stool throughout the colon. No wall thickening or inflammatory stranding identified. Appendix is normal. Vascular/Lymphatic: Aortic atherosclerosis. No enlarged abdominal or pelvic lymph nodes. Reproductive: Prostate gland is enlarged measuring 6.5 cm in diameter. Other: No abdominal wall hernia or abnormality. No abdominopelvic ascites. Musculoskeletal:  No acute or significant osseous findings. IMPRESSION: 1. Nonobstructing intrarenal stone on the left. 2. No hydronephrosis or hydroureter. No ureteral stone or obstruction. 3. Bladder wall thickening may be due to cystitis or outlet obstruction. Correlate with urinalysis. 4. Prostate gland is enlarged. 5. Interstitial fibrosis and honeycomb changes in the periphery of the lung bases suggesting usual interstitial pneumonitis. Electronically Signed   By: Elsie Gravely M.D.   On: 03/31/2024 16:58    Medications: Infusions:   Scheduled Medications:  insulin  aspart  0-15 Units Subcutaneous TID WC   insulin  aspart  0-5 Units Subcutaneous QHS   insulin  aspart  3 Units Subcutaneous TID WC   levothyroxine   137 mcg Oral QAC breakfast    have reviewed scheduled and prn medications.  Physical Exam: General:NAD, comfortable Heart:RRR, s1s2 nl Lungs:clear b/l, no crackle Abdomen:soft, Non-tender, non-distended Extremities:No edema Neurology: Alert, awake and following commands.  Mailee Klaas Amelie Romney 04/02/2024,11:06 AM  LOS: 1 day

## 2024-04-02 NOTE — TOC Progression Note (Signed)
 Transition of Care [facility]) - Progression Note    Patient Details  Name: Patrick [name] MRN: [id number] Date of Birth: [birth year]  Transition of [facility]) CM/SW Contact  Hoy DELENA Bigness, LCSW Phone Number: 04/02/2024, 1:23 PM  Clinical Narrative:    CSW met with pt to discuss referral for OPPT. Pt shares [place] is typically very [place]. Pt declines referral being made for OPPT but, agrees to follow up with his PCP should he change his mind.                      Expected Discharge Plan and Services                                               Social Drivers of Health (SDOH) Interventions SDOH Screenings   Food Insecurity: No Food Insecurity (03/31/2024)  Housing: Low Risk  (03/31/2024)  Transportation Needs: No Transportation Needs (03/31/2024)  Utilities: Not At Risk (04/01/2024)  Recent Concern: Utilities - At Risk (03/31/2024)  Alcohol Screen: Low Risk  (11/13/2022)  Depression (PHQ2-9): Low Risk  (01/28/2024)  Financial Resource Strain: Low Risk  (11/13/2022)  Physical Activity: Sufficiently Active (11/13/2022)  Social Connections: Moderately Isolated (03/31/2024)  Stress: No Stress Concern Present (11/13/2022)  Tobacco Use: Low Risk  (03/31/2024)    Readmission Risk Interventions     No data to display

## 2024-04-02 NOTE — Progress Notes (Signed)
 PT Cancellation Note  Patient Details Name: Patrick [name] MRN: [id number] DOB: 1946-05-19   Cancelled Treatment:    Reason Eval/Treat Not Completed: PT screened, no needs identified, will sign off.  Upon arrival for OT/PT co-evaluation, pt reports he had already been ambulating to/from restroom independently. Pt screened. Ambulates to/from restroom independently without AD. Pt does demonstrate mild imbalance at times when walking throughout halls. Credits it to being in bed the past few days. Would recommend Outpatient PT services for balance, as patient's overall functional level does not warrant inpatient services.  DO, LCSW and Case management made aware. Patient does not present with urgent need for skilled physical therapy acutely at this time. Patient discharged to care of nursing for ambulation daily as tolerated for length of stay.     12:18 PM, 04/02/24 Rosaria Settler, PT, DPT [place] with [facility]

## 2024-04-03 DIAGNOSIS — N179 Acute kidney failure, unspecified: Secondary | ICD-10-CM | POA: Diagnosis not present

## 2024-04-03 LAB — BASIC METABOLIC PANEL WITH GFR
Anion gap: 12 (ref 5–15)
BUN: 52 mg/dL — ABNORMAL HIGH (ref 8–23)
CO2: 27 mmol/L (ref 22–32)
Calcium: 8 mg/dL — ABNORMAL LOW (ref 8.9–10.3)
Chloride: 98 mmol/L (ref 98–111)
Creatinine, Ser: 3.07 mg/dL — ABNORMAL HIGH (ref 0.61–1.24)
GFR, Estimated: 20 mL/min — ABNORMAL LOW (ref >60)
Glucose, Bld: 164 mg/dL — ABNORMAL HIGH (ref 70–99)
Potassium: 3.6 mmol/L (ref 3.5–5.1)
Sodium: 137 mmol/L (ref 135–145)

## 2024-04-03 LAB — GLUCOSE, CAPILLARY
Glucose-Capillary: 162 mg/dL — ABNORMAL HIGH (ref 70–99)
Glucose-Capillary: 170 mg/dL — ABNORMAL HIGH (ref 70–99)
Glucose-Capillary: 206 mg/dL — ABNORMAL HIGH (ref 70–99)

## 2024-04-03 LAB — MAGNESIUM: Magnesium: 2 mg/dL (ref 1.7–2.4)

## 2024-04-03 MED ORDER — LINAGLIPTIN 5 MG PO TABS
5.0000 mg | ORAL_TABLET | Freq: Every day | ORAL | Status: DC
  Administered 2024-04-03: 5 mg via ORAL
  Filled 2024-04-03: qty 1

## 2024-04-03 MED ORDER — LINAGLIPTIN 5 MG PO TABS: 5.0000 mg | ORAL_TABLET | Freq: Every day | ORAL | 1 refills | Status: DC

## 2024-04-03 NOTE — Discharge Summary (Signed)
 Physician Discharge Summary  Patrick [name] FMW:[id number] DOB: 1946-01-15 DOA: 03/31/2024  PCP: Dettinger, Fonda LABOR, MD  Admit date: 03/31/2024  Discharge date: 04/03/2024  Admitted From:Home  Disposition:  Home  Recommendations for Outpatient Follow-up:  Follow up with PCP in 1-2 weeks Please obtain BMP in 1 week to follow creatinine levels and avoid use of NSAIDs and lisinopril  at this time, may resume lisinopril  once creatinine back to baseline.  Blood pressures have been well-controlled during this admission Monitor blood glucose carefully at home and avoid metformin  and glimepiride  use for now and use Tradjenta  and hopefully resume these medications in the near future once creatinine levels are back to baseline Continue other home medications as prior  Home Health: None  Equipment/Devices: None  Discharge Condition:Stable  CODE STATUS: Full  Diet recommendation: Heart Healthy/carb modified  Brief/Interim Summary: Patrick [name] is a 78 y.o. male with medical history significant for type 2 diabetes, hypertension, CKD stage IIIa, and hypothyroidism who presented to the ED via EMS from urgent care with complaints of worsening weakness and fatigue.  Patient was admitted with AKI on CKD stage IIIb with noted hyperkalemia and metabolic acidosis.  Patient was thought to have heat related injury with dehydration, but overall has improved significantly at this point without further use of IV fluid.  He is now in stable condition for discharge and has recommendations as noted above.  No other acute events or concerns noted.  Discharge Diagnoses:  Principal Problem:   AKI (acute kidney injury) (HCC) Active Problems:   Type 2 diabetes mellitus with other specified complication (HCC)   Hypertension associated with diabetes (HCC)   Hypothyroidism   CKD (chronic kidney disease), stage III (HCC)  Principal discharge diagnosis: Severe AKI on CKD stage IIIb with electrolyte abnormalities  and metabolic acidosis.  Hypoglycemia related to sulfonylurea use in the setting of AKI.  Discharge Instructions  Discharge Instructions     Diet - low sodium heart healthy   Complete by: As directed    Increase activity slowly   Complete by: As directed       Allergies as of 04/03/2024       Reactions   Codeine Other (See Comments)   Excessive sweating, lightheadedness. Led to a hospitalization.        Medication List     STOP taking these medications    glimepiride  4 MG tablet Commonly known as: AMARYL    ibuprofen 200 MG tablet Commonly known as: ADVIL   lisinopril  10 MG tablet Commonly known as: ZESTRIL    metFORMIN  1000 MG tablet Commonly known as: GLUCOPHAGE    naproxen sodium 220 MG tablet Commonly known as: ALEVE   ROLAIDS PO       TAKE these medications    levothyroxine  137 MCG tablet Commonly known as: SYNTHROID  Take 1 tablet (137 mcg total) by mouth daily before breakfast.   linagliptin  5 MG Tabs tablet Commonly known as: TRADJENTA  Take 1 tablet (5 mg total) by mouth daily. Start taking on: April 04, 2024   SV Iron  325 (65 Fe) MG tablet Generic drug: ferrous sulfate  Take 1 tablet by mouth once daily        Follow-up Information     Dettinger, Fonda LABOR, MD. Schedule an appointment as soon as possible for a visit in 1 week(s).   Specialties: Family Medicine, Cardiology Contact information: [address] [place] [address] [phone]                Allergies  Allergen Reactions  Codeine Other (See Comments)    Excessive sweating, lightheadedness. Led to a hospitalization.    Consultations: Nephrology   Procedures/Studies: CT Renal Stone Study Result Date: 03/31/2024 CLINICAL DATA:  Abdominal and flank pain with stone suspected. Weakness. Generalized abdominal pain. Elevated creatinine. EXAM: CT ABDOMEN AND PELVIS WITHOUT CONTRAST TECHNIQUE: Multidetector CT imaging of the abdomen and pelvis was performed following  the standard protocol without IV contrast. RADIATION DOSE REDUCTION: This exam was performed according to the departmental dose-optimization program which includes automated exposure control, adjustment of the mA and/or kV according to patient size and/or use of iterative reconstruction technique. COMPARISON:  Abdominal ultrasound 11/09/2013 FINDINGS: Lower chest: Peripheral interstitial fibrosis with honeycomb changes suggesting usual interstitial pneumonitis. Distal esophageal wall thickening may indicate reflux disease or esophagitis. No distention of the visualized esophagus. Hepatobiliary: No focal liver abnormality is seen. No gallstones, gallbladder wall thickening, or biliary dilatation. Pancreas: Unremarkable. No pancreatic ductal dilatation or surrounding inflammatory changes. Spleen: Normal in size without focal abnormality. Adrenals/Urinary Tract: No adrenal gland nodules. 2 mm stone in the lower pole left kidney. No hydronephrosis or hydroureter. No ureteral stones or bladder stones. Mild bladder wall thickening could be due to cystitis or outlet obstruction. Correlate with urinalysis. Stomach/Bowel: Stomach, small bowel, and colon are not abnormally distended. Stool throughout the colon. No wall thickening or inflammatory stranding identified. Appendix is normal. Vascular/Lymphatic: Aortic atherosclerosis. No enlarged abdominal or pelvic lymph nodes. Reproductive: Prostate gland is enlarged measuring 6.5 cm in diameter. Other: No abdominal wall hernia or abnormality. No abdominopelvic ascites. Musculoskeletal: No acute or significant osseous findings. IMPRESSION: 1. Nonobstructing intrarenal stone on the left. 2. No hydronephrosis or hydroureter. No ureteral stone or obstruction. 3. Bladder wall thickening may be due to cystitis or outlet obstruction. Correlate with urinalysis. 4. Prostate gland is enlarged. 5. Interstitial fibrosis and honeycomb changes in the periphery of the lung bases suggesting  usual interstitial pneumonitis. Electronically Signed   By: Elsie Gravely M.D.   On: 03/31/2024 16:58     Discharge Exam: Vitals:   04/02/24 2029 04/03/24 0453  BP: (!) 145/69 (!) 149/76  Pulse: 75 73  Resp: 20 20  Temp: (!) 97.5 F (36.4 C) 98.8 F (37.1 C)  SpO2: 97% 99%   Vitals:   04/02/24 0425 04/02/24 1240 04/02/24 2029 04/03/24 0453  BP: (!) 173/80 (!) 160/73 (!) 145/69 (!) 149/76  Pulse: 75 82 75 73  Resp: 16 18 20 20   Temp: 97.8 F (36.6 C) 97.6 F (36.4 C) (!) 97.5 F (36.4 C) 98.8 F (37.1 C)  TempSrc: Oral Oral Oral Oral  SpO2: 98% 97% 97% 99%  Weight:      Height:        General: Pt is alert, awake, not in acute distress Cardiovascular: RRR, S1/S2 +, no rubs, no gallops Respiratory: CTA bilaterally, no wheezing, no rhonchi Abdominal: Soft, NT, ND, bowel sounds + Extremities: no edema, no cyanosis    The results of significant diagnostics from this hospitalization (including imaging, microbiology, ancillary and laboratory) are listed below for reference.     Microbiology: No results found for this or any previous visit (from the past 240 hours).   Labs: BNP (last 3 results) No results for input(s): BNP in the last 8760 hours. Basic Metabolic Panel: Recent Labs  Lab 03/31/24 1202 03/31/24 1824 04/01/24 0403 04/02/24 0403 04/03/24 0407  NA 129* 130* 130* 136 137  K 5.7* 5.3* 4.3 4.3 3.6  CL 98 101 104 99 98  CO2 10*  10* 14* 24 27  GLUCOSE 35* 97 34* 167* 164*  BUN 102* 99* 93* 74* 52*  CREATININE 9.39* 8.34* 7.43* 4.87* 3.07*  CALCIUM 9.6 8.7* 8.1* 8.1* 8.0*  MG  --   --  2.4 2.1 2.0  PHOS  --   --   --  4.0  --    Liver Function Tests: Recent Labs  Lab 03/31/24 1000 03/31/24 1202 04/02/24 0403  AST 27 37  --   ALT 20 23  --   ALKPHOS 65 53  --   BILITOT 0.4 1.1  --   PROT 7.2 7.6  --   ALBUMIN 4.0 3.8 3.0*   No results for input(s): LIPASE, AMYLASE in the last 168 hours. No results for input(s): AMMONIA in the last  168 hours. CBC: Recent Labs  Lab 03/31/24 1000 03/31/24 1202 04/01/24 0403 04/02/24 0403  WBC 14.7* 15.4* 11.5* 7.5  NEUTROABS 13.3* 13.9*  --   --   HGB 11.7* 11.5* 9.7* 10.1*  HCT 37.1* 33.8* 28.6* 29.4*  MCV 98* 93.4 92.9 91.9  PLT 146* 148* 110* 126*   Cardiac Enzymes: Recent Labs  Lab 03/31/24 1000 03/31/24 1202  CKTOTAL 710* 925*  CKMBINDEX 28.1*  --    BNP: Invalid input(s): POCBNP CBG: Recent Labs  Lab 04/02/24 1642 04/02/24 2032 04/03/24 0023 04/03/24 0455 04/03/24 0728  GLUCAP 164* 307* 170* 162* 206*   D-Dimer No results for input(s): DDIMER in the last 72 hours. Hgb A1c Recent Labs    03/31/24 1202  HGBA1C 7.0*   Lipid Profile No results for input(s): CHOL, HDL, LDLCALC, TRIG, CHOLHDL, LDLDIRECT in the last 72 hours. Thyroid  function studies No results for input(s): TSH, T4TOTAL, T3FREE, THYROIDAB in the last 72 hours.  Invalid input(s): FREET3 Anemia work up Recent Labs    03/31/24 1202  VITAMINB12 148*  FOLATE 3.8*  FERRITIN 97  TIBC 265  IRON  73  RETICCTPCT 1.3   Urinalysis    Component Value Date/Time   COLORURINE STRAW (A) 04/01/2024 1009   APPEARANCEUR CLEAR 04/01/2024 1009   LABSPEC 1.006 04/01/2024 1009   PHURINE 5.0 04/01/2024 1009   GLUCOSEU >=500 (A) 04/01/2024 1009   HGBUR MODERATE (A) 04/01/2024 1009   BILIRUBINUR NEGATIVE 04/01/2024 1009   KETONESUR NEGATIVE 04/01/2024 1009   PROTEINUR 30 (A) 04/01/2024 1009   NITRITE NEGATIVE 04/01/2024 1009   LEUKOCYTESUR NEGATIVE 04/01/2024 1009   Sepsis Labs Recent Labs  Lab 03/31/24 1000 03/31/24 1202 04/01/24 0403 04/02/24 0403  WBC 14.7* 15.4* 11.5* 7.5   Microbiology No results found for this or any previous visit (from the past 240 hours).   Time coordinating discharge: 35 minutes  SIGNED:   Adron JONETTA Fairly, DO [facility] 04/03/2024, 9:22 AM  If 7PM-7AM, please contact night-coverage [email or web address]

## 2024-04-03 NOTE — Plan of Care (Signed)
°  Problem: Education: °Goal: Knowledge of General Education information will improve °Description: Including pain rating scale, medication(s)/side effects and non-pharmacologic comfort measures °Outcome: Progressing °  °Problem: Nutrition: °Goal: Adequate nutrition will be maintained °Outcome: Progressing °  °Problem: Skin Integrity: °Goal: Risk for impaired skin integrity will decrease °Outcome: Progressing °  °

## 2024-04-03 NOTE — Progress Notes (Signed)
 Brief Nephrology Note  Following up labs over the weekend. Crt much improved to 3. Our team will sign off. As suggested yesterday, patient's PCP can refer to nephrology outpatient if needed.

## 2024-04-05 ENCOUNTER — Telehealth: Payer: Self-pay

## 2024-04-05 NOTE — Transitions of Care (Post Inpatient/ED Visit) (Signed)
   04/05/2024  Name: Patrick [name] MRN: [id number] DOB: 02-Jun-1946  Today's TOC FU Call Status: Today's TOC FU Call Status:: Successful TOC FU Call Completed TOC FU Call Complete Date: 04/05/24 Patient's Name and Date of Birth confirmed.  Transition Care Management Follow-up Telephone Call Date of Discharge: 04/03/24 [facility] Type of Discharge: Inpatient Admission Primary Inpatient Discharge Diagnosis:: Acute Kidney Injury How have you been since you were released from the hospital?: Better Any questions or concerns?: Yes Patient Questions/Concerns:: Problems with constipation Patient Questions/Concerns Addressed: Provided Patient Educational Materials  Items Reviewed: Did you receive and understand the discharge instructions provided?: Yes Medications obtained,verified, and reconciled?: Yes (Medications Reviewed) Any new allergies since your discharge?: No Dietary orders reviewed?: NA Do you have support at home?: No  Medications Reviewed Today: Medications Reviewed Today     Reviewed by Lavelle Charmaine NOVAK, LPN (Licensed Practical Nurse) on 04/05/24 at 1145  Med List Status: <None>   Medication Order Taking? Sig Documenting Provider Last Dose Status Informant  ferrous sulfate  (SV IRON ) 325 (65 FE) MG tablet [id number] Yes Take 1 tablet by mouth once daily Dettinger, Fonda LABOR, MD  Active Self, Pharmacy Records, Other  levothyroxine  (SYNTHROID ) 137 MCG tablet [id number] Yes Take 1 tablet (137 mcg total) by mouth daily before breakfast. Dettinger, Fonda LABOR, MD  Active Self, Pharmacy Records, Other  linagliptin  (TRADJENTA ) 5 MG TABS tablet [id number] Yes Take 1 tablet (5 mg total) by mouth daily. Maree Bracken D, DO  Active   Med List Note (Ward, Angelica, CPhT 03/31/24 1501): Medication was provided for confirmation            Home Care and Equipment/Supplies: Were Home Health Services Ordered?: NA Any new equipment or medical supplies ordered?:  NA  Functional Questionnaire: Do you need assistance with bathing/showering or dressing?: No Do you need assistance with meal preparation?: No Do you need assistance with eating?: No Do you have difficulty maintaining continence: No Do you need assistance with getting out of bed/getting out of a chair/moving?: No Do you have difficulty managing or taking your medications?: Yes  Follow up appointments reviewed: PCP Follow-up appointment confirmed?: Yes Date of PCP follow-up appointment?: 04/07/24 Follow-up Provider: Dr. Maryanne Specialist [facility] Follow-up appointment confirmed?: NA Do you need transportation to your follow-up appointment?: No Do you understand care options if your condition(s) worsen?: Yes-patient verbalized understanding    SIGNATURE Charmaine Lavelle, LPN [facility] Health Advisor [place] [facility] You Are. We Are. [facility] Direct Dial [phone]

## 2024-04-07 ENCOUNTER — Encounter: Payer: Self-pay | Admitting: Family Medicine

## 2024-04-07 ENCOUNTER — Ambulatory Visit

## 2024-04-07 VITALS — BP 152/62 | HR 91 | Ht 70.0 in | Wt 187.0 lb

## 2024-04-07 DIAGNOSIS — N1831 Chronic kidney disease, stage 3a: Secondary | ICD-10-CM

## 2024-04-07 DIAGNOSIS — E1169 Type 2 diabetes mellitus with other specified complication: Secondary | ICD-10-CM | POA: Diagnosis not present

## 2024-04-07 DIAGNOSIS — E875 Hyperkalemia: Secondary | ICD-10-CM | POA: Diagnosis not present

## 2024-04-07 DIAGNOSIS — N179 Acute kidney failure, unspecified: Secondary | ICD-10-CM

## 2024-04-07 DIAGNOSIS — Z7984 Long term (current) use of oral hypoglycemic drugs: Secondary | ICD-10-CM

## 2024-04-07 DIAGNOSIS — B356 Tinea cruris: Secondary | ICD-10-CM

## 2024-04-07 MED ORDER — LISINOPRIL 10 MG PO TABS: 10.0000 mg | ORAL_TABLET | Freq: Every day | ORAL | 3 refills | Status: DC

## 2024-04-07 NOTE — Progress Notes (Signed)
 BP (!) 162/81   Pulse 91   Ht 5' 10 (1.778 m)   Wt 187 lb (84.8 kg)   SpO2 99%   BMI 26.83 kg/m    Subjective:   Patient ID: Patrick [name], male    DOB: [birth year], 78 y.o.   MRN: [id number]  HPI: Patrick [name] is a 78 y.o. male presenting on 04/07/2024 for Hospitalization Follow-up (AKI, Hyperkalemia )   Discussed the use of AI scribe software for clinical note transcription with the patient, who gave verbal consent to proceed.  History of Present Illness   Patrick [name] is a 78 year old male who presents for a hospital follow-up visit after acute on chronic kidney injury.  He was initially seen on March 31, 2024, with dizziness and dehydration and was sent to the emergency department. He was admitted to the hospital on the same day and discharged on April 03, 2024. During his hospitalization, he was diagnosed with acute on chronic kidney injury, hyperkalemia, and metabolic acidosis. It was suspected that he suffered from heat exhaustion with dehydration due to working outside for a week and not drinking enough fluids.  During his hospital stay, his creatinine levels were noted to be as high as 8 or 9, which have since decreased to 3. Prior to this event, his creatinine was 1.7. He was taken off several medications, including metformin , ibuprofen, Aleve, and Advil, due to their potential impact on his kidneys. Currently, he is taking Tradjenta , a thyroid  medication, and an iron  pill. He drinks lots of fluids and urinates frequently, though he notes some irregularity in bowel movements, which he attributes to the iron  supplement.  He says he is feeling a lot better and feeling back to normal and trying to stay hydrated more.  He developed a rash in his groin area on Monday, April 05, 2024, after being discharged from the hospital. The rash does not itch or hurt. He mentioned that he had not been wearing underwear prior to the rash appearing but started wearing it again once the rash  developed.  He experiences a little pain in his rib area, which he attributes to pulling a muscle while trying to go to the bathroom.          Relevant past medical, surgical, family and social history reviewed and updated as indicated. Interim medical history since our last visit reviewed. Allergies and medications reviewed and updated.  Review of Systems  Constitutional:  Negative for chills, fatigue and fever.  Respiratory:  Negative for shortness of breath and wheezing.   Cardiovascular:  Negative for chest pain and leg swelling.  Skin:  Positive for rash. Negative for color change.  Neurological:  Negative for dizziness, weakness and light-headedness.  All other systems reviewed and are negative.   Per HPI unless specifically indicated above   Allergies as of 04/07/2024       Reactions   Codeine Other (See Comments)   Excessive sweating, lightheadedness. Led to a hospitalization.        Medication List        Accurate as of April 07, 2024 10:25 AM. If you have any questions, ask your nurse or doctor.          levothyroxine  137 MCG tablet Commonly known as: SYNTHROID  Take 1 tablet (137 mcg total) by mouth daily before breakfast.   linagliptin  5 MG Tabs tablet Commonly known as: TRADJENTA  Take 1 tablet (5 mg total) by mouth daily.   lisinopril  10 MG tablet Commonly  known as: ZESTRIL  Take 1 tablet (10 mg total) by mouth daily. Started by: Fonda LABOR Diandra Cimini   SV Iron  325 (65 Fe) MG tablet Generic drug: ferrous sulfate  Take 1 tablet by mouth once daily         Objective:   BP (!) 162/81   Pulse 91   Ht 5' 10 (1.778 m)   Wt 187 lb (84.8 kg)   SpO2 99%   BMI 26.83 kg/m   Wt Readings from Last 3 Encounters:  04/07/24 187 lb (84.8 kg)  03/31/24 192 lb 14.4 oz (87.5 kg)  01/28/24 193 lb (87.5 kg)    Physical Exam Physical Exam   NECK: Thyroid  non-tender. CHEST: Lungs clear to auscultation bilaterally. CARDIOVASCULAR: Heart regular rate and  rhythm. ABDOMEN: No costovertebral angle tenderness. SKIN: Rash in left groin, consistent with jock itch.         Assessment & Plan:   Problem List Items Addressed This Visit       Endocrine   Type 2 diabetes mellitus with other specified complication (HCC)   Relevant Medications   lisinopril  (ZESTRIL ) 10 MG tablet   Other Relevant Orders   AMB Referral VBCI Care Management     Genitourinary   CKD (chronic kidney disease), stage III (HCC)   Relevant Orders   AMB Referral VBCI Care Management   CBC with Differential/Platelet   CMP14+EGFR   Ambulatory referral to Nephrology   AKI (acute kidney injury) (HCC) - Primary   Relevant Orders   AMB Referral VBCI Care Management   CBC with Differential/Platelet   CMP14+EGFR   Ambulatory referral to Nephrology   Other Visit Diagnoses       Hyperkalemia       Relevant Orders   CBC with Differential/Platelet   CMP14+EGFR   Ambulatory referral to Nephrology     Tinea cruris            Assessment and Plan    Acute on chronic kidney injury with hyperkalemia and metabolic acidosis Acute on chronic kidney injury likely due to dehydration and heat exhaustion. Creatinine reduced from 8-9 to 3. Hyperkalemia and metabolic acidosis present. Metformin , ibuprofen, Aleve, and Advil discontinued to prevent further kidney damage. - Discontinue metformin . - Discontinue ibuprofen, Aleve, and Advil. - Order blood work to monitor kidney function. - Encourage fluid intake.  Dehydration and heat exhaustion Dehydration and heat exhaustion likely from prolonged outdoor activity and insufficient fluid intake. Hospitalization required for management. - Encourage adequate fluid intake.  Hypertension Blood pressure elevated, possibly due to discontinuation of antihypertensive medications during hospitalization. May need to restart medication based on blood work. - Recheck blood pressure. - Consider restarting blood pressure medication based on  blood work results.  Tinea cruris (jock itch), left groin Tinea cruris in left groin. No itching or pain reported. - Advise use of over-the-counter jock itch cream twice daily. - Recommend keeping the area dry and well-ventilated.  Medication management Metformin  discontinued due to kidney function. Current medications include Hydrea, Tradjenta , thyroid  medication, and iron . Prescription assistance needed for affordability. - Schedule appointment with pharmacist Mliss Griffin for prescription assistance. - Continue current medications: Hydrea, Tradjenta , thyroid  medication, and iron . - Call him with blood work results. - Schedule follow-up appointment based on blood work results.     Placed referral to clinical pharmacist for prescription assistance for Tradjenta      Follow up plan: Return in about 4 weeks (around 05/05/2024), or if symptoms worsen or fail to improve, for Hypertension and CKD  recheck.  Counseling provided for all of the vaccine components Orders Placed This Encounter  Procedures   CBC with Differential/Platelet   CMP14+EGFR   AMB Referral VBCI Care Management   Ambulatory referral to Nephrology    Fonda Levins, MD [facility] 04/07/2024, 10:25 AM

## 2024-04-08 LAB — CMP14+EGFR
ALT: 27 IU/L (ref 0–44)
AST: 25 IU/L (ref 0–40)
Albumin: 4.1 g/dL (ref 3.8–4.8)
Alkaline Phosphatase: 77 IU/L (ref 44–121)
BUN/Creatinine Ratio: 12 (ref 10–24)
BUN: 25 mg/dL (ref 8–27)
Bilirubin Total: 0.4 mg/dL (ref 0.0–1.2)
CO2: 21 mmol/L (ref 20–29)
Calcium: 9.2 mg/dL (ref 8.6–10.2)
Chloride: 98 mmol/L (ref 96–106)
Creatinine, Ser: 2.11 mg/dL — AB (ref 0.76–1.27)
Globulin, Total: 2.6 g/dL (ref 1.5–4.5)
Glucose: 313 mg/dL — AB (ref 70–99)
Potassium: 5.3 mmol/L — AB (ref 3.5–5.2)
Sodium: 136 mmol/L (ref 134–144)
Total Protein: 6.7 g/dL (ref 6.0–8.5)
eGFR: 32 mL/min/1.73 — AB (ref >59)

## 2024-04-08 LAB — CBC WITH DIFFERENTIAL/PLATELET
Basophils Absolute: 0.1 x10E3/uL (ref 0.0–0.2)
Basos: 1 %
EOS (ABSOLUTE): 0.1 x10E3/uL (ref 0.0–0.4)
Eos: 1 %
Hematocrit: 36.5 % — ABNORMAL LOW (ref 37.5–51.0)
Hemoglobin: 11.5 g/dL — ABNORMAL LOW (ref 13.0–17.7)
Immature Grans (Abs): 0.1 x10E3/uL (ref 0.0–0.1)
Immature Granulocytes: 1 %
Lymphocytes Absolute: 1.1 x10E3/uL (ref 0.7–3.1)
Lymphs: 10 %
MCH: 31 pg (ref 26.6–33.0)
MCHC: 31.5 g/dL (ref 31.5–35.7)
MCV: 98 fL — ABNORMAL HIGH (ref 79–97)
Monocytes Absolute: 0.8 x10E3/uL (ref 0.1–0.9)
Monocytes: 7 %
Neutrophils Absolute: 9.2 x10E3/uL — ABNORMAL HIGH (ref 1.4–7.0)
Neutrophils: 80 %
Platelets: 175 x10E3/uL (ref 150–450)
RBC: 3.71 x10E6/uL — ABNORMAL LOW (ref 4.14–5.80)
RDW: 12.7 % (ref 11.6–15.4)
WBC: 11.4 x10E3/uL — ABNORMAL HIGH (ref 3.4–10.8)

## 2024-04-09 ENCOUNTER — Ambulatory Visit

## 2024-04-09 VITALS — Ht 70.0 in | Wt 186.0 lb

## 2024-04-09 DIAGNOSIS — Z0000 Encounter for general adult medical examination without abnormal findings: Secondary | ICD-10-CM | POA: Diagnosis not present

## 2024-04-09 NOTE — Progress Notes (Signed)
 Subjective:   Patrick [name] is a 78 y.o. who presents for a Medicare Wellness preventive visit.  As a reminder, Annual Wellness Visits don't include a physical exam, and some assessments may be limited, especially if this visit is performed virtually. We may recommend an in-person follow-up visit with your provider if needed.  Visit Complete: Virtual I connected with  [name] on 04/09/24 by a audio enabled telemedicine application and verified that I am speaking with the correct person using two identifiers.  Patient Location: Home  Provider Location: Home Office  I discussed the limitations of evaluation and management by telemedicine. The patient expressed understanding and agreed to proceed.  Vital Signs: Because this visit was a virtual/telehealth visit, some criteria may be missing or patient reported. Any vitals not documented were not able to be obtained and vitals that have been documented are patient reported.  VideoDeclined- This patient declined Librarian, academic. Therefore the visit was completed with audio only.  Persons Participating in Visit: Patient.  AWV Questionnaire: No: Patient Medicare AWV questionnaire was not completed prior to this visit.  Cardiac Risk Factors include: advanced age (>40men, >6 women);diabetes mellitus;hypertension;male gender     Objective:    Today's Vitals   04/09/24 0901 04/09/24 0902  Weight: 186 lb (84.4 kg)   Height: 5' 10 (1.778 m)   PainSc:  0-No pain   Body mass index is 26.69 kg/m.     04/09/2024    9:05 AM 03/31/2024   11:55 AM 11/13/2022    8:27 AM  Advanced Directives  Does Patient Have a Medical Advance Directive? No No Yes  Type of Best boy of [place];Living will  Copy of Healthcare Power of Attorney in Chart?   No - copy requested  Would patient like information on creating a medical advance directive? No - Patient declined No - Patient declined      Current Medications (verified) Outpatient Encounter Medications as of 04/09/2024  Medication Sig   ferrous sulfate  (SV IRON ) 325 (65 FE) MG tablet Take 1 tablet by mouth once daily   levothyroxine  (SYNTHROID ) 137 MCG tablet Take 1 tablet (137 mcg total) by mouth daily before breakfast.   linagliptin  (TRADJENTA ) 5 MG TABS tablet Take 1 tablet (5 mg total) by mouth daily.   lisinopril  (ZESTRIL ) 10 MG tablet Take 1 tablet (10 mg total) by mouth daily. (Patient not taking: Reported on 04/09/2024)   No facility-administered encounter medications on file as of 04/09/2024.    Allergies (verified) Codeine   History: Past Medical History:  Diagnosis Date   Diabetes mellitus without complication (HCC)    Hypertension    Thyroid  disease    Past Surgical History:  Procedure Laterality Date   None     Family History  Problem Relation Age of Onset   Diabetes Mother    Diabetes Brother    Colon cancer Neg Hx    Liver disease Neg Hx    Social History   Socioeconomic History   Marital status: Single    Spouse name: Not on file   Number of children: Not on file   Years of education: Not on file   Highest education level: Not on file  Occupational History   Not on file  Tobacco Use   Smoking status: Never    Passive exposure: Never   Smokeless tobacco: Never  Vaping Use   Vaping status: Never Used  Substance and Sexual Activity   Alcohol use: No  Drug use: No   Sexual activity: Not on file  Other Topics Concern   Not on file  Social History Narrative   Not on file   Social Drivers of Health   Financial Resource Strain: Low Risk  (04/09/2024)   Overall Financial Resource Strain (CARDIA)    Difficulty of Paying Living Expenses: Not hard at all  Food Insecurity: No Food Insecurity (04/09/2024)   Hunger Vital Sign    Worried About Running Out of Food in the Last Year: Never true    Ran Out of Food in the Last Year: Never true  Transportation Needs: No Transportation Needs  (04/09/2024)   PRAPARE - Administrator, Civil Service (Medical): No    Lack of Transportation (Non-Medical): No  Physical Activity: Sufficiently Active (04/09/2024)   Exercise Vital Sign    Days of Exercise per Week: 7 days    Minutes of Exercise per Session: 30 min  Stress: No Stress Concern Present (04/09/2024)   [employer] of Occupational Health - Occupational Stress Questionnaire    Feeling of Stress: Not at all  Social Connections: Moderately Integrated (04/09/2024)   Social Connection and Isolation Panel    Frequency of Communication with Friends and Family: More than three times a week    Frequency of Social Gatherings with Friends and Family: More than three times a week    Attends Religious Services: More than 4 times per year    Active Member of [employer] or Organizations: Yes    Attends Engineer, structural: More than 4 times per year    Marital Status: Never married  Recent Concern: Social Connections - Moderately Isolated (03/31/2024)   Social Connection and Isolation Panel    Frequency of Communication with Friends and Family: More than three times a week    Frequency of Social Gatherings with Friends and Family: More than three times a week    Attends Religious Services: 1 to 4 times per year    Active Member of [employer] or Organizations: No    Attends Engineer, structural: Never    Marital Status: Never married    Tobacco Counseling Counseling given: Yes    Clinical Intake:  Pre-visit preparation completed: Yes  Pain : No/denies pain Pain Score: 0-No pain     BMI - recorded: 26.69 Nutritional Status: BMI 25 -29 Overweight Diabetes: Yes CBG done?: No (telehealth visit.) Did pt. bring in CBG monitor from home?: No  Lab Results  Component Value Date   HGBA1C 7.0 (H) 03/31/2024   HGBA1C 6.6 (H) 01/28/2024   HGBA1C 6.2 (H) 10/03/2023     How often do you need to have someone help you when you read instructions, pamphlets, or  other written materials from your doctor or pharmacy?: 1 - Never  Interpreter Needed?: No  Information entered by :: Stefano ORN [facility]   Activities of Daily Living     04/09/2024    9:05 AM 03/31/2024    2:40 PM  In your present state of health, do you have any difficulty performing the following activities:  Hearing? 0 0  Vision? 0 0  Difficulty concentrating or making decisions? 0 0  Walking or climbing stairs? 0   Dressing or bathing? 0   Doing errands, shopping? 0   Preparing Food and eating ? N   Using the Toilet? N   In the past six months, have you accidently leaked urine? N   Do you have problems with loss of bowel  control? N   Managing your Medications? N   Managing your Finances? N   Housekeeping or managing your Housekeeping? N     Patient Care Team: Dettinger, Fonda LABOR, MD as PCP - General (Family Medicine) Billee Mliss BIRCH, [facility] (Pharmacist)  I have updated your Care Teams any recent Medical Services you may have received from other providers in the past year.     Assessment:   This is a routine wellness examination for Davidlee.  Hearing/Vision screen Hearing Screening - Comments:: Patient denies any hearing difficulties.   Vision Screening - Comments:: Wears rx glasses - up to date with routine eye exams with  [facility]   Goals Addressed             This Visit's Progress    Patient Stated       I want to remain active and healthy       Depression Screen     04/09/2024    9:09 AM 04/07/2024   10:05 AM 01/28/2024    1:05 PM 10/17/2023    8:19 AM 10/03/2023    9:33 AM 11/13/2022    8:26 AM 09/25/2022   11:18 AM  PHQ 2/9 Scores  PHQ - 2 Score 0 0 0 0 0 0 0  PHQ- 9 Score 0     0 0    Fall Risk     04/09/2024    9:07 AM 04/07/2024   10:04 AM 01/28/2024    1:05 PM 10/17/2023    8:19 AM 10/03/2023    9:33 AM  Fall Risk   Falls in the past year? 0 0 0 0 0  Number falls in past yr: 0 0 0    Injury with Fall? 0 0 0    Risk for fall due to : No  Fall Risks No Fall Risks No Fall Risks    Follow up Falls evaluation completed;Education provided;Falls prevention discussed Falls evaluation completed Falls evaluation completed      MEDICARE RISK AT HOME:  Medicare Risk at Home Any stairs in or around the home?: Yes If so, are there any without handrails?: Yes Home free of loose throw rugs in walkways, pet beds, electrical cords, etc?: Yes Adequate lighting in your home to reduce risk of falls?: Yes Life alert?: No Use of a cane, walker or w/c?: No Grab bars in the bathroom?: No Shower chair or bench in shower?: No Elevated toilet seat or a handicapped toilet?: No  TIMED UP AND GO:  Was the test performed?  No  Cognitive Function: 6CIT completed        04/09/2024    9:07 AM 11/13/2022    8:27 AM  6CIT Screen  What Year? 0 points 0 points  What month? 0 points 0 points  What time? 0 points 0 points  Count back from 20 0 points 0 points  Months in reverse 0 points 0 points  Repeat phrase 0 points 0 points  Total Score 0 points 0 points    Immunizations Immunization History  Administered Date(s) Administered   Fluad Quad(high Dose 65+) 07/13/2019   Influenza, High Dose Seasonal PF 07/13/2018   Influenza,inj,Quad PF,6+ Mos 06/13/2016, 06/11/2017   Influenza-Unspecified 06/02/2014, 06/21/2015   Pneumococcal Conjugate-13 07/13/2019   Pneumococcal Polysaccharide-23 09/22/2013   Tdap 09/22/2013    Screening Tests Health Maintenance  Topic Date Due   Zoster Vaccines- Shingrix (1 of 2) Never done   OPHTHALMOLOGY EXAM  04/28/2014   INFLUENZA VACCINE  04/02/2024   Colonoscopy  10/02/2024 (Originally 05/04/1993)   DTaP/Tdap/Td (2 - Td or Tdap) 01/27/2025 (Originally 09/23/2023)   HEMOGLOBIN A1C  10/01/2024   Diabetic kidney evaluation - Urine ACR  10/02/2024   FOOT EXAM  10/02/2024   Diabetic kidney evaluation - eGFR measurement  04/07/2025   Medicare Annual Wellness (AWV)  04/09/2025   Pneumococcal Vaccine: 50+ Years   Completed   Hepatitis C Screening  Completed   Hepatitis B Vaccines  Aged Out   HPV VACCINES  Aged Out   Meningococcal B Vaccine  Aged Out   COVID-19 Vaccine  Discontinued    Health Maintenance  Health Maintenance Due  Topic Date Due   Zoster Vaccines- Shingrix (1 of 2) Never done   OPHTHALMOLOGY EXAM  04/28/2014   INFLUENZA VACCINE  04/02/2024   Health Maintenance Items Addressed: Patient is past due for yearly eye exam. He is aware of this and will call his provider to schedule that appointment  Additional Screening:  Vision Screening: Recommended annual ophthalmology exams for early detection of glaucoma and other disorders of the eye. Would you like a referral to an eye doctor? No    Dental Screening: Recommended annual dental exams for proper oral hygiene  Community Resource Referral / Chronic Care Management: CRR required this visit?  No   CCM required this visit?  No   Plan:    I have personally reviewed and noted the following in the patient's chart:   Medical and social history Use of alcohol, tobacco or illicit drugs  Current medications and supplements including opioid prescriptions. Patient is not currently taking opioid prescriptions. Functional ability and status Nutritional status Physical activity Advanced directives List of other physicians Hospitalizations, surgeries, and ER visits in previous 12 months Vitals Screenings to include cognitive, depression, and falls Referrals and appointments  In addition, I have reviewed and discussed with patient certain preventive protocols, quality metrics, and best practice recommendations. A written personalized care plan for preventive services as well as general preventive health recommendations were provided to patient.   Tahji Fowler, CMA   04/09/2024   After Visit Summary: (Mail) Due to this being a telephonic visit, the after visit summary with patients personalized plan was offered to patient via mail    Notes: Nothing significant to report at this time.

## 2024-04-09 NOTE — Patient Instructions (Signed)
 Patrick [name] , Thank you for taking time out of your busy schedule to complete your Annual Wellness Visit with me. I enjoyed our conversation and look forward to speaking with you again next year. I, as well as your care team,  appreciate your ongoing commitment to your health goals. Please review the following plan we discussed and let me know if I can assist you in the future. Your Game plan/ To Do List    Follow up Visits: We will see or speak with you next year for your Next Medicare AWV with our clinical staff.  Next Wellness April 12, 2025 at 8:40 am telephone visit  Clinician Recommendations:  Aim for 30 minutes of exercise or brisk walking, 6-8 glasses of water , and 5 servings of fruits and vegetables each day.    Wishing you many blessings and good health during the next year until our next visit.  -Lee Kuang   This is a list of the screenings recommended for you:  Health Maintenance  Topic Date Due   Zoster (Shingles) Vaccine (1 of 2) Never done   Eye exam for diabetics  04/28/2014   Flu Shot  04/02/2024   Colon Cancer Screening  10/02/2024*   DTaP/Tdap/Td vaccine (2 - Td or Tdap) 01/27/2025*   Hemoglobin A1C  10/01/2024   Yearly kidney health urinalysis for diabetes  10/02/2024   Complete foot exam   10/02/2024   Yearly kidney function blood test for diabetes  04/07/2025   Medicare Annual Wellness Visit  04/09/2025   Pneumococcal Vaccine for age over 25  Completed   Hepatitis C Screening  Completed   Hepatitis B Vaccine  Aged Out   HPV Vaccine  Aged Out   Meningitis B Vaccine  Aged Out   COVID-19 Vaccine  Discontinued  *Topic was postponed. The date shown is not the original due date.    Advanced directives: (Declined) Advance directive discussed with you today. Even though you declined this today, please call our office should you change your mind, and we can give you the proper paperwork for you to fill out. Advance Care Planning is important because it:  [x]  Makes  sure you receive the medical care that is consistent with your values, goals, and preferences  [x]  It provides guidance to your family and loved ones and reduces their decisional burden about whether or not they are making the right decisions based on your wishes.  Follow the link provided in your after visit summary or read over the paperwork we have mailed to you to help you started getting your Advance Directives in place. If you need assistance in completing these, please reach out to us  so that we can help you!  See attachments for Preventive Care and Fall Prevention Tips.

## 2024-04-12 ENCOUNTER — Ambulatory Visit: Payer: Self-pay | Admitting: Family Medicine

## 2024-04-12 DIAGNOSIS — E1169 Type 2 diabetes mellitus with other specified complication: Secondary | ICD-10-CM

## 2024-04-12 DIAGNOSIS — N1831 Chronic kidney disease, stage 3a: Secondary | ICD-10-CM

## 2024-04-14 ENCOUNTER — Telehealth: Payer: Self-pay | Admitting: Family Medicine

## 2024-04-14 NOTE — Telephone Encounter (Signed)
 Pt informed that per Dr. Maryanne pt is to take his Lisinopril  for htn.

## 2024-04-14 NOTE — Telephone Encounter (Signed)
 Pt wants to know if he needs to go back to taking BP Pill?

## 2024-04-22 ENCOUNTER — Ambulatory Visit (INDEPENDENT_AMBULATORY_CARE_PROVIDER_SITE_OTHER): Admitting: Pharmacist

## 2024-04-22 VITALS — Ht 70.0 in | Wt 186.0 lb

## 2024-04-22 DIAGNOSIS — E119 Type 2 diabetes mellitus without complications: Secondary | ICD-10-CM | POA: Diagnosis not present

## 2024-04-22 DIAGNOSIS — Z7984 Long term (current) use of oral hypoglycemic drugs: Secondary | ICD-10-CM

## 2024-04-22 DIAGNOSIS — E1169 Type 2 diabetes mellitus with other specified complication: Secondary | ICD-10-CM

## 2024-04-22 NOTE — Progress Notes (Signed)
 04/22/2024 Name: Patrick [name] MRN: [id number] DOB: April 10, 1946  Chief Complaint  Patient presents with   Diabetes    Patrick [name] is a 78 y.o. year old male who was referred for medication management by their primary care provider, Dettinger, Fonda LABOR, MD. They presented for a face to face visit today.   They were referred to the pharmacist by their PCP for assistance in managing diabetes    Subjective:  Care Team: Primary Care Provider: Dettinger, Fonda LABOR, MD ; Next Scheduled Visit: 05/04/2024  Medication Access/Adherence  Current Pharmacy:  [facility] [address] [place] - [address] [place] [address] Phone: [phone] Fax: [phone]  [id number] [place], [place] - [address] [place] [address] Phone: [phone] Fax: [phone]   Patient reports affordability concerns with their medications: Yes  Patient reports access/transportation concerns to their pharmacy: No  Patient reports adherence concerns with their medications:  No     Diabetes:  Current medications: Linagliptin  5mg   Medications tried in the past: Metformin , glipizide, and  -Patient stated that he is drinking water  bottles all day long.   Patient was hospitalized for AKI, during this metformin  and glipizide was discontinued.   Current physical activity: States that he is very active.  Last UACR 357 on 10/03/2023   Objective:  Lab Results  Component Value Date   HGBA1C 7.0 (H) 03/31/2024    Lab Results  Component Value Date   CREATININE 2.11 (H) 04/07/2024   BUN 25 04/07/2024   NA 136 04/07/2024   K 5.3 (H) 04/07/2024   CL 98 04/07/2024   CO2 21 04/07/2024    Lab Results  Component Value Date   CHOL 138 01/28/2024   HDL 40 01/28/2024   LDLCALC 61 01/28/2024   LDLDIRECT 67 12/23/2016   TRIG 225 (H) 01/28/2024   CHOLHDL 3.5 01/28/2024    Medications Reviewed Today     Reviewed by Garland Dixon, Student-PharmD (Student-PharmD) on 04/22/24 at [phone]  Med List Status: <None>   Medication Order Taking? Sig Documenting Provider Last Dose Status Informant  ferrous sulfate  (SV IRON ) 325 (65 FE) MG tablet [id number] Yes Take 1 tablet by mouth once daily Dettinger, Joshua A, MD  Active Self, Pharmacy Records, Other  levothyroxine  (SYNTHROID ) 137 MCG tablet [id number] Yes Take 1 tablet (137 mcg total) by mouth daily before breakfast. Dettinger, Fonda LABOR, MD  Active Self, Pharmacy Records, Other  linagliptin  (TRADJENTA ) 5 MG TABS tablet [id number] Yes Take 1 tablet (5 mg total) by mouth daily. Maree, Pratik D, DO  Active   lisinopril  (ZESTRIL ) 10 MG tablet [id number] Yes Take 1 tablet (10 mg total) by mouth daily. Dettinger, Fonda LABOR, MD  Active   Med List Note (Ward, Angelica, CPhT 03/31/24 1501): Medication was provided for confirmation             Assessment/Plan:   Diabetes: - Currently controlled  - Start Farxiga  5mg  daily, will plan to increase to 10mg  daily - Meets financial criteria for Farxiga  patient [place] through [place]  - Recommend to stay hydrated and continue drinking water  throughout the day.   -Follow up on UACR results -Follow up on lipid panel after PCP visit.  -Follow up on initiation of a statin.   Follow Up Plan: 1 month  Dixon Garland, PharmD Candidate  [facility] [employer]    Mliss Tarry Griffin, PharmD, [place], CPP Clinical  Pharmacist, [facility]

## 2024-04-22 NOTE — Progress Notes (Deleted)
 04/22/2024 Name: Patrick [name] MRN: [id number] DOB: Aug 08, 1946  Chief Complaint  Patient presents with   Diabetes    Patrick [name] is a 78 y.o. year old male who was referred for medication management by their primary care provider, Dettinger, Fonda LABOR, MD. They presented for a face to face visit today.   They were referred to the pharmacist by their PCP for assistance in managing diabetes and medication access    Subjective:  Care Team: Primary Care Provider: Dettinger, Fonda LABOR, MD {careteamprovider:[id number]}  Medication Access/Adherence  Current Pharmacy:  [facility] [address] [place] - [address] [place] [address] Phone: [phone] Fax: [phone]  [id number] [place],  - [address] [place] [address] Phone: [phone] Fax: [phone]  [facility] - [place], [place] - [address] [place] [address] Phone: [phone] Fax: [phone]   Patient reports affordability concerns with their medications: {YES/NO:21197} Patient reports access/transportation concerns to their pharmacy: {YES/NO:21197} Patient reports adherence concerns with their medications:  {YES/NO:21197} ***   Diabetes:  Current medications: tradjenta  prescribed by hospital Medications tried in the past: metformin , glipizide  -Patient stated that he is drinking water  bottles all day long.    Patient was hospitalized for AKI, during this metformin  and glipizide was discontinued.    Current physical activity: States that he is very active.   Last UACR 357 on 10/03/2023  Current glucose readings: FBG<130  Patient denies hypoglycemic s/sx including dizziness, shakiness, sweating. Patient denies hyperglycemic symptoms including polyuria, polydipsia, polyphagia, nocturia, neuropathy, blurred vision.  Current meal patterns:  Discussed meal planning options and Plate method for  healthy eating Avoid sugary drinks and desserts Incorporate balanced protein, non starchy veggies, 1 serving of carbohydrate with each meal Increase water  intake Increase physical activity as able Current medication access support: will [place] in [place]  Macrovascular and Microvascular Risk Reduction:  Statin? no; no history of statin therapy; ACEi/ARB? yes (lisinopril , new sglt2) Last urinary albumin/creatinine ratio:  Lab Results  Component Value Date   MICRALBCREAT 210 (H) 04/22/2024   MICRALBCREAT 357 (H) 10/03/2023   MICRALBCREAT 424 (H) 09/25/2022   MICRALBCREAT 24.2 12/23/2016   Last eye exam:   Last foot exam: 10/03/2023 Tobacco Use:  Tobacco Use: Low Risk  (04/22/2024)   Patient History    Smoking Tobacco Use: Never    Smokeless Tobacco Use: Never    Passive Exposure: Never     Objective:  Lab Results  Component Value Date   HGBA1C 7.0 (H) 03/31/2024    Lab Results  Component Value Date   CREATININE 2.11 (H) 04/07/2024   BUN 25 04/07/2024   NA 136 04/07/2024   K 5.3 (H) 04/07/2024   CL 98 04/07/2024   CO2 21 04/07/2024    Lab Results  Component Value Date   CHOL 138 01/28/2024   HDL 40 01/28/2024   LDLCALC 61 01/28/2024   LDLDIRECT 67 12/23/2016   TRIG 225 (H) 01/28/2024   CHOLHDL 3.5 01/28/2024    Medications Reviewed Today     Reviewed by Billee Mliss BIRCH, [facility] (Pharmacist) on 05/07/24 at 1137  Med List Status: <None>   Medication Order Taking? Sig Documenting Provider Last Dose Status Informant  dapagliflozin  propanediol (FARXIGA ) 10 MG TABS tablet [id number]  Take 1 tablet (10 mg total) by mouth daily. Dettinger, Fonda LABOR, MD  Active   ferrous sulfate  (SV IRON ) 325 (65  FE) MG tablet [id number] Yes Take 1 tablet by mouth once daily Dettinger, Fonda LABOR, MD  Active Self, Pharmacy Records, Other  levothyroxine  (SYNTHROID ) 137 MCG tablet [id number] Yes Take 1 tablet (137 mcg total) by mouth daily before breakfast. Dettinger, Fonda LABOR, MD  Active  Self, Pharmacy Records, Other  lisinopril  (ZESTRIL ) 10 MG tablet [id number] Yes Take 1 tablet (10 mg total) by mouth daily. Dettinger, Fonda LABOR, MD  Active   Med List Note (Ward, Angelica, CPhT 03/31/24 1501): Medication was provided for confirmation             Assessment/Plan:   Diabetes: - Currently controlled  - Start Farxiga  5mg  daily  - Meets financial criteria for Farxiga  patient assistance program through [place].  - Recommend to stay hydrated and continue drinking water  throughout the day.   -Follow up on UACR results -Follow up on lipid panel after PCP visit.  -Follow up on initiation of a statin.   Follow Up Plan: ***  ***

## 2024-04-23 ENCOUNTER — Telehealth: Payer: Self-pay

## 2024-04-23 ENCOUNTER — Other Ambulatory Visit (HOSPITAL_COMMUNITY): Payer: Self-pay

## 2024-04-23 NOTE — Progress Notes (Signed)
 Pharmacy Medication Assistance Program Note    04/28/2024  Patient ID: Patrick [name], male   DOB: [birth year], 78 y.o.   MRN: [id number]     04/23/2024  Outreach Medication One  Manufacturer Medication One Art therapist [employer] Farxiga  Dose of Farxiga 10MG   Type of Assistance Manufacturer Assistance  Date Application Received From Patient 04/22/2024  Date Application Received From Provider 04/22/2024  Date Application Submitted to Manufacturer 04/23/2024  Method Application Sent to Manufacturer Fax  Patient Assistance Determination Approved  Approval Start Date 04/26/2024  Approval End Date 09/01/2024     New - APPROVED

## 2024-04-24 LAB — MICROALBUMIN / CREATININE URINE RATIO
Creatinine, Urine: 32.4 mg/dL
Microalb/Creat Ratio: 210 mg/g{creat} — ABNORMAL HIGH (ref 0–29)
Microalbumin, Urine: 68 ug/mL

## 2024-04-29 ENCOUNTER — Ambulatory Visit: Payer: Self-pay | Admitting: Family Medicine

## 2024-05-05 ENCOUNTER — Ambulatory Visit: Admitting: Family Medicine

## 2024-05-07 ENCOUNTER — Telehealth: Payer: Self-pay | Admitting: Pharmacist

## 2024-05-07 DIAGNOSIS — E1169 Type 2 diabetes mellitus with other specified complication: Secondary | ICD-10-CM

## 2024-05-07 MED ORDER — DAPAGLIFLOZIN PROPANEDIOL 10 MG PO TABS: 10.0000 mg | ORAL_TABLET | Freq: Every day | ORAL | 3 refills | Status: DC

## 2024-05-07 NOTE — Telephone Encounter (Signed)
 05/07/2024 Name: Patrick [name] MRN: [id number] DOB: Dec 15, 1945  Chief Complaint  Patient presents with   Diabetes    Patrick [name] is [name] 78 y.o. year old male who was referred for medication management by their primary care provider, Patrick [name], Patrick LABOR, MD. They presented for [name] face to face visit today.   They were referred to the pharmacist by their PCP for assistance in managing diabetes    Subjective:  Care Team: Primary Care Provider: Dettinger, Patrick LABOR, MD ; Next Scheduled Visit: 05/04/2024  Medication Access/Adherence  Current Pharmacy:  [facility] [address] [place] - [address] [place] [address] Phone: [phone] Fax: [phone]  [id number] [place], [place] - [address] [place] [address] Phone: [phone] Fax: [phone]  [facility] - [place], [place] - [address] [place] [address] Phone: [phone] Fax: [phone]   Patient reports affordability concerns with their medications: Yes  Patient reports access/transportation concerns to their pharmacy: No  Patient reports adherence concerns with their medications:  No     Diabetes:  Current medications: Farxiga  5mg -->10mg   Medications tried in the past: Metformin , glipizide, and Linagliptin   -Patient stated that he is drinking water  bottles all day long.   Patient was hospitalized for AKI, during this metformin  and glipizide was discontinued.   Current physical activity: States that he is very active.  Last UACR 210 on 04/22/2024   Objective:  Lab Results  Component Value Date   HGBA1C 7.0 (H) 03/31/2024    Lab Results  Component Value Date   CREATININE 2.11 (H) 04/07/2024   BUN 25 04/07/2024   NA 136 04/07/2024   K 5.3 (H) 04/07/2024   CL 98 04/07/2024   CO2 21 04/07/2024    Lab Results  Component Value Date   CHOL 138 01/28/2024   HDL 40 01/28/2024   LDLCALC 61  01/28/2024   LDLDIRECT 67 12/23/2016   TRIG 225 (H) 01/28/2024   CHOLHDL 3.5 01/28/2024    Medications Reviewed Today     Reviewed by Patrick [name], [facility] (Pharmacist) on 05/07/24 at 1131  Med List Status: <None>   Medication Order Taking? Sig Documenting Provider Last Dose Status Informant  ferrous sulfate  (SV IRON ) 325 (65 FE) MG tablet [id number]  Take 1 tablet by mouth once daily Patrick [name], Patrick A, MD  Active Self, Pharmacy Records, Other  levothyroxine  (SYNTHROID ) 137 MCG tablet [id number]  Take 1 tablet (137 mcg total) by mouth daily before breakfast. Patrick [name], Patrick LABOR, MD  Active Self, Pharmacy Records, Other    Discontinued 05/07/24 1130 (Completed Course)   lisinopril  (ZESTRIL ) 10 MG tablet [id number]  Take 1 tablet (10 mg total) by mouth daily. Patrick [name], Patrick LABOR, MD  Active   Med List Note (Ward, Angelica, CPhT 03/31/24 1501): Medication was provided for confirmation             Assessment/Plan:   Diabetes: - Currently controlled  - Start Farxiga  5mg  daily, patient now has Farxiga  10mg  #90 day supply from patient assistance.  Counseled patient to start this once he finishes farxiga  5mg  samples.  Continue adequate hydration - Enrolled in Farxiga  patient assistance program through [place] and [place].  -Follow up on lipid panel after PCP visit.  -Follow up on initiation of [name] statin.  -Uacr collected at last visit--was 210; improved, continue SGLT2  Follow Up Plan: PCP    Patrick [name],  PharmD, Patrick [name], CPP Clinical Pharmacist, [facility]

## 2024-05-12 ENCOUNTER — Telehealth: Payer: Self-pay

## 2024-05-12 ENCOUNTER — Ambulatory Visit: Admitting: Family Medicine

## 2024-05-12 ENCOUNTER — Encounter: Payer: Self-pay | Admitting: Family Medicine

## 2024-05-12 VITALS — BP 131/67 | HR 77 | Temp 90.8°F | Ht 70.0 in | Wt 184.0 lb

## 2024-05-12 DIAGNOSIS — E1159 Type 2 diabetes mellitus with other circulatory complications: Secondary | ICD-10-CM

## 2024-05-12 DIAGNOSIS — N1831 Chronic kidney disease, stage 3a: Secondary | ICD-10-CM

## 2024-05-12 DIAGNOSIS — I152 Hypertension secondary to endocrine disorders: Secondary | ICD-10-CM

## 2024-05-12 DIAGNOSIS — E1169 Type 2 diabetes mellitus with other specified complication: Secondary | ICD-10-CM | POA: Diagnosis not present

## 2024-05-12 DIAGNOSIS — E038 Other specified hypothyroidism: Secondary | ICD-10-CM | POA: Diagnosis not present

## 2024-05-12 DIAGNOSIS — E875 Hyperkalemia: Secondary | ICD-10-CM

## 2024-05-12 DIAGNOSIS — Z7984 Long term (current) use of oral hypoglycemic drugs: Secondary | ICD-10-CM

## 2024-05-12 LAB — BAYER DCA HB A1C WAIVED: HB A1C (BAYER DCA - WAIVED): 11.9 % — ABNORMAL HIGH (ref 4.8–5.6)

## 2024-05-12 MED ORDER — RYBELSUS 7 MG PO TABS: 7.0000 mg | ORAL_TABLET | Freq: Every day | ORAL | 5 refills | Status: AC

## 2024-05-12 MED ORDER — RYBELSUS 3 MG PO TABS: 3.0000 mg | ORAL_TABLET | Freq: Every day | ORAL | 0 refills | Status: DC

## 2024-05-12 MED ORDER — ONETOUCH ULTRASOFT LANCETS MISC: 1.0000 | Freq: Two times a day (BID) | 3 refills | Status: DC

## 2024-05-12 MED ORDER — GLIMEPIRIDE 2 MG PO TABS: 2.0000 mg | ORAL_TABLET | Freq: Every day | ORAL | 3 refills | Status: DC

## 2024-05-12 NOTE — Telephone Encounter (Signed)
 Per Dr. Maryanne pt needs to be seen by Mliss ASAP. A1C today is 11. Dettinger wants pt to start Rybelsus .

## 2024-05-12 NOTE — Progress Notes (Signed)
 BP 131/67   Pulse 77   Temp (!) 90.8 F (32.7 C)   Ht 5' 10 (1.778 m)   Wt 184 lb (83.5 kg)   SpO2 96%   BMI 26.40 kg/m    Subjective:   Patient ID: Patrick [name], male    DOB: 10/19/1945, 78 y.o.   MRN: [id number]  HPI: Patrick [name] is a 78 y.o. male presenting on 05/12/2024 for Medical Management of Chronic Issues, Diabetes, and Hypertension   Discussed the use of AI scribe software for clinical note transcription with the patient, who gave verbal consent to proceed.  History of Present Illness   Patrick [name] is a 78 year old male with diabetes, hypertension, and hypothyroidism who presents for a recheck of his conditions.  He struggles with diabetes management due to an aversion to needles, which hinders regular blood glucose monitoring. He uses a One Touch meter but requires lancets for finger sticks. His hemoglobin A1c has increased to 11.9% from a previous 7%. He experiences polyuria, urinating approximately every hour and a half, including nocturia. He occasionally consumes sodas and sweet tea, which may contribute to his elevated blood glucose levels. He is currently taking Farxiga  for diabetes management.  He is on levothyroxine  for hypothyroidism, which he takes daily without issues. For hypertension, he takes lisinopril . Additionally, he takes an iron  supplement. He had a previous adverse reaction to metformin  affecting his kidneys and has never been on insulin  therapy.  He drinks water  frequently to prevent dehydration due to high blood glucose levels. He was previously on glimepiride  without issues. He is unfamiliar with newer diabetes medications like Ozempic or Trulicity and is reluctant towards injectable treatments.          Relevant past medical, surgical, family and social history reviewed and updated as indicated. Interim medical history since our last visit reviewed. Allergies and medications reviewed and updated.  Review of Systems   Constitutional:  Negative for chills and fever.  Eyes:  Negative for visual disturbance.  Respiratory:  Negative for shortness of breath and wheezing.   Cardiovascular:  Negative for chest pain and leg swelling.  Musculoskeletal:  Negative for back pain and gait problem.  Skin:  Negative for rash.  Neurological:  Negative for dizziness and light-headedness.  All other systems reviewed and are negative.   Per HPI unless specifically indicated above   Allergies as of 05/12/2024       Reactions   Codeine Other (See Comments)   Excessive sweating, lightheadedness. Led to a hospitalization.   Metformin  And Related Other (See Comments)   AKI         Medication List        Accurate as of May 12, 2024  9:11 AM. If you have any questions, ask your nurse or doctor.          dapagliflozin  propanediol 10 MG Tabs tablet Commonly known as: FARXIGA  Take 1 tablet (10 mg total) by mouth daily.   glimepiride  2 MG tablet Commonly known as: AMARYL  Take 1 tablet (2 mg total) by mouth daily before breakfast. Started by: Fonda LABOR Clarnce Homan   levothyroxine  137 MCG tablet Commonly known as: SYNTHROID  Take 1 tablet (137 mcg total) by mouth daily before breakfast.   lisinopril  10 MG tablet Commonly known as: ZESTRIL  Take 1 tablet (10 mg total) by mouth daily.   onetouch ultrasoft lancets 1 each by Other route 2 (two) times daily. Use as instructed Started by: Fonda LABOR Mikena Masoner   Rybelsus  3  MG Tabs Generic drug: Semaglutide  Take 1 tablet (3 mg total) by mouth daily. Started by: Fonda LABOR Argil Mahl   Rybelsus  7 MG Tabs Generic drug: Semaglutide  Take 1 tablet (7 mg total) by mouth daily. Started by: Fonda LABOR Nyssa Sayegh   SV Iron  325 (65 Fe) MG tablet Generic drug: ferrous sulfate  Take 1 tablet by mouth once daily         Objective:   BP 131/67   Pulse 77   Temp (!) 90.8 F (32.7 C)   Ht 5' 10 (1.778 m)   Wt 184 lb (83.5 kg)   SpO2 96%   BMI 26.40 kg/m    Wt Readings from Last 3 Encounters:  05/12/24 184 lb (83.5 kg)  04/22/24 186 lb (84.4 kg)  04/09/24 186 lb (84.4 kg)    Physical Exam Physical Exam   VITALS: BP- 131/67 CHEST: Lungs clear to auscultation. CARDIOVASCULAR: Heart regular rate and rhythm, no murmurs.         Assessment & Plan:   Problem List Items Addressed This Visit       Cardiovascular and Mediastinum   Hypertension associated with diabetes (HCC)   Relevant Medications   glimepiride  (AMARYL ) 2 MG tablet   Semaglutide  (RYBELSUS ) 3 MG TABS   Semaglutide  (RYBELSUS ) 7 MG TABS     Endocrine   Type 2 diabetes mellitus with other specified complication (HCC) - Primary   Relevant Medications   glimepiride  (AMARYL ) 2 MG tablet   Semaglutide  (RYBELSUS ) 3 MG TABS   Semaglutide  (RYBELSUS ) 7 MG TABS   Lancets (ONETOUCH ULTRASOFT) lancets   Other Relevant Orders   Bayer DCA Hb A1c Waived   Hypothyroidism   Relevant Orders   TSH     Genitourinary   CKD (chronic kidney disease), stage III (HCC)   Relevant Orders   CBC with Differential/Platelet   CMP14+EGFR   Other Visit Diagnoses       Hyperkalemia              Type 2 diabetes mellitus with poor glycemic control A1c at 11.9% indicates poor control. Symptoms of frequent urination likely due to hyperglycemia. Adverse effects from metformin . Averse to injectables. Discussed Rybelsus  as an alternative oral medication. - Continue Farxiga . - Restart glimepiride . - Initiate Rybelsus . - Refer to pharmacist Mliss for Rybelsus  acquisition. - Advise discontinuation of soda. - Encourage increased water  intake.  Chronic kidney disease, stage 3a Metformin  contraindicated due to adverse effects.  Other specified hypothyroidism Well-managed on levothyroxine . No new symptoms. - Continue levothyroxine .          Follow up plan: Return in about 3 months (around 08/11/2024), or if symptoms worsen or fail to improve, for Diabetes recheck.  Counseling provided  for all of the vaccine components Orders Placed This Encounter  Procedures   Bayer DCA Hb A1c Waived   CBC with Differential/Platelet   CMP14+EGFR   TSH    Fonda Levins, MD [facility] 05/12/2024, 9:11 AM

## 2024-05-13 ENCOUNTER — Telehealth: Payer: Self-pay | Admitting: *Deleted

## 2024-05-13 ENCOUNTER — Ambulatory Visit: Payer: Self-pay

## 2024-05-13 ENCOUNTER — Ambulatory Visit: Payer: Self-pay | Admitting: Family Medicine

## 2024-05-13 LAB — CBC WITH DIFFERENTIAL/PLATELET
Basophils Absolute: 0.1 x10E3/uL (ref 0.0–0.2)
Basos: 1 %
EOS (ABSOLUTE): 0.1 x10E3/uL (ref 0.0–0.4)
Eos: 2 %
Hematocrit: 38.2 % (ref 37.5–51.0)
Hemoglobin: 11.8 g/dL — ABNORMAL LOW (ref 13.0–17.7)
Immature Grans (Abs): 0 x10E3/uL (ref 0.0–0.1)
Immature Granulocytes: 0 %
Lymphocytes Absolute: 1 x10E3/uL (ref 0.7–3.1)
Lymphs: 17 %
MCH: 31.6 pg (ref 26.6–33.0)
MCHC: 30.9 g/dL — ABNORMAL LOW (ref 31.5–35.7)
MCV: 102 fL — ABNORMAL HIGH (ref 79–97)
Monocytes Absolute: 0.4 x10E3/uL (ref 0.1–0.9)
Monocytes: 7 %
Neutrophils Absolute: 4.2 x10E3/uL (ref 1.4–7.0)
Neutrophils: 73 %
Platelets: 109 x10E3/uL — ABNORMAL LOW (ref 150–450)
RBC: 3.73 x10E6/uL — ABNORMAL LOW (ref 4.14–5.80)
RDW: 12.2 % (ref 11.6–15.4)
WBC: 5.8 x10E3/uL (ref 3.4–10.8)

## 2024-05-13 LAB — CMP14+EGFR
ALT: 28 IU/L (ref 0–44)
AST: 22 IU/L (ref 0–40)
Albumin: 3.9 g/dL (ref 3.8–4.8)
Alkaline Phosphatase: 84 IU/L (ref 44–121)
BUN/Creatinine Ratio: 14 (ref 10–24)
BUN: 37 mg/dL — AB (ref 8–27)
Bilirubin Total: 0.5 mg/dL (ref 0.0–1.2)
CO2: 16 mmol/L — AB (ref 20–29)
Calcium: 9.5 mg/dL (ref 8.6–10.2)
Chloride: 97 mmol/L (ref 96–106)
Creatinine, Ser: 2.58 mg/dL — AB (ref 0.76–1.27)
Globulin, Total: 2.8 g/dL (ref 1.5–4.5)
Glucose: 590 mg/dL — AB (ref 70–99)
Potassium: 5.7 mmol/L — AB (ref 3.5–5.2)
Sodium: 130 mmol/L — AB (ref 134–144)
Total Protein: 6.7 g/dL (ref 6.0–8.5)
eGFR: 25 mL/min/1.73 — AB (ref >59)

## 2024-05-13 LAB — TSH: TSH: 2.09 u[IU]/mL (ref 0.450–4.500)

## 2024-05-13 NOTE — Telephone Encounter (Signed)
 Duplicate and already addressed in another encounter.

## 2024-05-13 NOTE — Telephone Encounter (Signed)
 E2C2 called with critical lab. GLUCOSE 590

## 2024-05-13 NOTE — Telephone Encounter (Signed)
 FYI Only or Action Required?: Action required by provider: update on patient condition. Glucose 590 per Darice at [place] Patient was last seen in primary care on 05/12/2024 by Dettinger, Fonda LABOR, MD.  Called Nurse Triage reporting Labs Only.  Symptoms began today.  Interventions attempted: Other: n/a.  Symptoms are: n/a.  Triage Disposition: Call PCP Within 24 Hours  Patient/caregiver understands and will follow disposition?: Yes Copied from CRM #[id number]. Topic: Clinical - Lab/Test Results >> May 13, 2024 11:52 AM Delon DASEN wrote: Reason for CRM: Darice with [employer]- critical labs Reason for Disposition  Lab or radiology calling with test results  Answer Assessment - Initial Assessment Questions 1. REASON FOR CALL or QUESTION: What is your reason for calling today? or How can I best     Lab results     Reporting Glucose 590 2. CALLER: Document the source of call. (e.g., laboratory staff, caregiver or patient).     Darice at [employer]   Reported to Kendrick, LPN at [facility]  Protocols used: PCP Call - No Triage-A-AH

## 2024-05-18 ENCOUNTER — Other Ambulatory Visit

## 2024-05-18 ENCOUNTER — Other Ambulatory Visit: Payer: Self-pay

## 2024-05-18 DIAGNOSIS — N289 Disorder of kidney and ureter, unspecified: Secondary | ICD-10-CM

## 2024-05-19 ENCOUNTER — Ambulatory Visit: Payer: Self-pay | Admitting: Family Medicine

## 2024-05-19 ENCOUNTER — Telehealth: Payer: Self-pay

## 2024-05-19 DIAGNOSIS — E875 Hyperkalemia: Secondary | ICD-10-CM

## 2024-05-19 LAB — BMP8+EGFR
BUN/Creatinine Ratio: 21 (ref 10–24)
BUN: 50 mg/dL — ABNORMAL HIGH (ref 8–27)
CO2: 18 mmol/L — ABNORMAL LOW (ref 20–29)
Calcium: 9.1 mg/dL (ref 8.6–10.2)
Chloride: 102 mmol/L (ref 96–106)
Creatinine, Ser: 2.38 mg/dL — ABNORMAL HIGH (ref 0.76–1.27)
Glucose: 420 mg/dL (ref 70–99)
Potassium: 6 mmol/L (ref 3.5–5.2)
Sodium: 133 mmol/L — ABNORMAL LOW (ref 134–144)
eGFR: 28 mL/min/1.73 — ABNORMAL LOW (ref >59)

## 2024-05-19 MED ORDER — SODIUM POLYSTYRENE SULFONATE PO POWD: Freq: Once | ORAL | 0 refills | Status: AC

## 2024-05-19 NOTE — Telephone Encounter (Signed)
 Refer to lab results - has already been discussed with patient

## 2024-05-19 NOTE — Telephone Encounter (Signed)
 Copied from CRM [id number]. Topic: Clinical - Lab/Test Results >> May 19, 2024  8:49 AM Herma G wrote: Reason for CRM: Staff called to request a phone call back to discuss recent pt's critical lab results, call back at [phone] opt 1 to discuss, staff will also fax over results.

## 2024-05-20 ENCOUNTER — Other Ambulatory Visit

## 2024-05-20 ENCOUNTER — Telehealth: Payer: Self-pay | Admitting: Family Medicine

## 2024-05-20 DIAGNOSIS — E875 Hyperkalemia: Secondary | ICD-10-CM

## 2024-05-20 DIAGNOSIS — E1169 Type 2 diabetes mellitus with other specified complication: Secondary | ICD-10-CM

## 2024-05-20 MED ORDER — LEVOTHYROXINE SODIUM 137 MCG PO TABS: 137.0000 ug | ORAL_TABLET | Freq: Every day | ORAL | 0 refills | Status: DC

## 2024-05-20 MED ORDER — GLIMEPIRIDE 2 MG PO TABS: 2.0000 mg | ORAL_TABLET | Freq: Every day | ORAL | 0 refills | Status: DC

## 2024-05-20 MED ORDER — FERROUS SULFATE 325 (65 FE) MG PO TABS: 325.0000 mg | ORAL_TABLET | Freq: Every day | ORAL | 0 refills | Status: DC

## 2024-05-20 MED ORDER — DAPAGLIFLOZIN PROPANEDIOL 10 MG PO TABS: 10.0000 mg | ORAL_TABLET | Freq: Every day | ORAL | 0 refills | Status: DC

## 2024-05-20 MED ORDER — LISINOPRIL 10 MG PO TABS: 10.0000 mg | ORAL_TABLET | Freq: Every day | ORAL | 0 refills | Status: DC

## 2024-05-20 NOTE — Telephone Encounter (Unsigned)
 Copied from CRM [id number]. Topic: Clinical - Prescription Issue >> May 20, 2024  4:14 PM Susanna ORN wrote: Reason for CRM: Josette, with [facility], calling to clarify directions for medication, Sodium Polystyrene Powder. Please give her a call back at [phone].

## 2024-05-20 NOTE — Telephone Encounter (Signed)
 [facility] [place] they don't have what he needs and has to wait 1-2 days for them to get it.

## 2024-05-20 NOTE — Telephone Encounter (Signed)
 Aware all meds except the [facility]belsus  [facility]

## 2024-05-21 ENCOUNTER — Ambulatory Visit (INDEPENDENT_AMBULATORY_CARE_PROVIDER_SITE_OTHER): Admitting: Family Medicine

## 2024-05-21 ENCOUNTER — Ambulatory Visit: Payer: Self-pay | Admitting: Family Medicine

## 2024-05-21 LAB — BMP8+EGFR
BUN/Creatinine Ratio: 16 (ref 10–24)
BUN: 34 mg/dL — AB (ref 8–27)
CO2: 17 mmol/L — AB (ref 20–29)
Calcium: 9.5 mg/dL (ref 8.6–10.2)
Chloride: 102 mmol/L (ref 96–106)
Creatinine, Ser: 2.1 mg/dL — AB (ref 0.76–1.27)
Glucose: 428 mg/dL — AB (ref 70–99)
Potassium: 5.3 mmol/L — ABNORMAL HIGH (ref 3.5–5.2)
Sodium: 133 mmol/L — ABNORMAL LOW (ref 134–144)
eGFR: 32 mL/min/1.73 — AB (ref >59)

## 2024-05-21 NOTE — Progress Notes (Signed)
 Patient came in with all of his medicines. Patient was very confused about what he is suppose to be taken and how. [facility] and had multiple bottles of the same medications. We went through each medication and put in the same bottle of the ones that were the same. We labeled each one for what it was for. Also that the levothyroxine  should be taken at least 30 min before the other medication on an empty stomach. Patient was very thankful that he could understand his medications now. He is also aware that he needs the appointment with Mliss to discuss assistance for the Rybelsus . ALSO [facility] [place] [facility].

## 2024-05-21 NOTE — Telephone Encounter (Signed)
 Patient came in and spoke with clinical

## 2024-05-24 NOTE — Progress Notes (Unsigned)
 05/25/2024 Name: Patrick [name] MRN: [id number] DOB: 1946-02-18  Chief Complaint  Patient presents with   Diabetes    Patrick [name] is a 78 y.o. year old male who presented for a telephone visit.They were referred to the pharmacist by their PCP for assistance in managing diabetes and medication access.    Subjective: Patrick [name] presents today for a telephone visit for medication access and diabetes. He has been doing well with no concerns. Reports taking both his Farxiga  and glimepiride , denies any missed doses. He has not been checking his blood sugar regularly and was unable to provide a reading today. He is still drinking a glass of sweet tea everyday or every other day, but has completely stopped drinking soda.   Care Team: Primary Care Provider: Dettinger, Fonda LABOR, MD ; Next Scheduled Visit: 08/02/2024  Medication Access/Adherence [facility]  [phone] [place] [place] - [address] [place] [address] Phone: [phone] Fax: [phone]  [facility] - [place], [place] - [address] [place] [address] Phone: [phone] Fax: [phone]  Patient reports affordability concerns with their medications: Yes  - Farxiga  via AZ&Me, pending Novo Nordisk PAP for Rybelsus   Patient reports access/transportation concerns to their pharmacy: No  Patient reports adherence concerns with their medications:  Yes  - barriers due to cost and needle aversion  Diabetes: Current medications: Farxiga  10 mg daily, Glimepiride  2 mg daily, Rybelsus  3 mg daily Medications tried in the past: metformin  (hx of AKI), Tradjenta  5 mg, glipizide Of note: patient has a history of needle aversion, will not inject insulin  Statin: none, may benefit from initiation in the future, shared-decision making for benefit vs risks LDL of 61 on 01/28/24 ACEi/ARB: lisinopril  10 mg daily UACR of 210 on 04/22/24  A1c of 11.9% on 04/11/24, increased  from 7% on 03/31/24  Current glucose readings: Hasn't been checking regularly, will work on checking it more regularly.  Patient denies hypoglycemic s/sx including dizziness, shakiness, sweating. Patient denies hyperglycemic symptoms including polyuria, polydipsia, polyphagia, nocturia, neuropathy, blurred vision.  Current meal patterns: Trying to avoid sugars, does drink one glass of sweet tea daily or every other day, and quit drinking sodas Discussed meal planning options and Plate method for healthy eating Avoid sugary drinks and desserts Incorporate balanced protein, non starchy veggies, 1 serving of carbohydrate with each meal Increase water  intake Increase physical activity as able  Current medication access support:  AZ&Me: Farxiga  10 mg daily Pending Novo Nordisk PAP for Rybelsus  7 mg daily   Macrovascular and Microvascular Risk Reduction: Statin? no; no history of statin therapy; LDL of 61 mg/dL on 4/71/74; however, the patient has a history of T2DM and may benefit from statin initiation. 78YO, so shared decision making is recommended for therapy initiation. ACEi/ARB? yes (lisinopril ) Last urinary albumin/creatinine ratio:  Lab Results  Component Value Date   MICRALBCREAT 210 (H) 04/22/2024   MICRALBCREAT 357 (H) 10/03/2023   MICRALBCREAT 424 (H) 09/25/2022   MICRALBCREAT 24.2 12/23/2016  Last foot exam: 10/03/2023 Tobacco Use:  Tobacco Use: Low Risk  (05/12/2024)   Patient History    Smoking Tobacco Use: Never    Smokeless Tobacco Use: Never    Passive Exposure: Never    Objective: Lab Results  Component Value Date   HGBA1C 11.9 (H) 05/12/2024   Lab Results  Component Value Date   CREATININE 2.10 (H) 05/20/2024   BUN 34 (H) 05/20/2024   NA 133 (  L) 05/20/2024   K 5.3 (H) 05/20/2024   CL 102 05/20/2024   CO2 17 (L) 05/20/2024   Lab Results  Component Value Date   CHOL 138 01/28/2024   HDL 40 01/28/2024   LDLCALC 61 01/28/2024   LDLDIRECT 67 12/23/2016    TRIG 225 (H) 01/28/2024   CHOLHDL 3.5 01/28/2024   Medications Reviewed Today     Reviewed by Bernette Falling, [facility] (Pharmacist) on 05/25/24 at [phone]  Med List Status: <None>   Medication Order Taking? Sig Documenting Provider Last Dose Status Informant  dapagliflozin  propanediol (FARXIGA ) 10 MG TABS tablet [id number] Yes Take 1 tablet (10 mg total) by mouth daily. Dettinger, Fonda LABOR, MD  Active   ferrous sulfate  (SV IRON ) 325 (65 FE) MG tablet [id number]  Take 1 tablet (325 mg total) by mouth daily. Dettinger, Fonda LABOR, MD  Active   glimepiride  (AMARYL ) 2 MG tablet [id number] Yes Take 1 tablet (2 mg total) by mouth daily before breakfast. Dettinger, Fonda LABOR, MD  Active   Lancets [facility] ULTRASOFT) lancets [id number]  1 each by Other route 2 (two) times daily. Use as instructed Dettinger, Fonda LABOR, MD  Active   levothyroxine  (SYNTHROID ) 137 MCG tablet [id number]  Take 1 tablet (137 mcg total) by mouth daily before breakfast. Dettinger, Fonda LABOR, MD  Active   lisinopril  (ZESTRIL ) 10 MG tablet [id number]  Take 1 tablet (10 mg total) by mouth daily. Dettinger, Fonda LABOR, MD  Active   Semaglutide  (RYBELSUS ) 3 MG TABS [id number]  Take 1 tablet (3 mg total) by mouth daily. Dettinger, Fonda LABOR, MD  Active   Semaglutide  (RYBELSUS ) 7 MG TABS [id number]  Take 1 tablet (7 mg total) by mouth daily. Dettinger, Fonda LABOR, MD  Active   Med List Note (Ward, Angelica, CPhT 03/31/24 1501): Medication was provided for confirmation            Assessment/Plan:  Diabetes: Currently uncontrolled. He is on glimepiride  and Farxiga , would benefit from additional therapy, such as Rybelsus . Will provide samples and begin the process for PAP.  Goal of <7% Reviewed long term cardiovascular and renal outcomes of uncontrolled blood sugar Reviewed goal A1c, goal fasting, and goal 2 hour post prandial glucose Reviewed dietary modifications including reducing sugar intake (via sweet tea), increasing hydration, incorporating  protein with each meal, and increase physical activity as able. Recommend to:   Continue Farxiga  10 mg daily Continue glimepiride  2 mg daily START Rybelsus  3 mg daily  Provided samples, left at front desk with PAP form. He is coming into clinic to pick-up medication tomorrow morning.  Novo Nordisk PAP for Rybelsus  7 mg daily STARTED today. Will continue to collaborate with provider, CPhT, and patient to pursue assistance.  He is to come into office tomorrow, plans on signing documentation at that time.  Patient denies personal or family history of multiple endocrine neoplasia type 2, medullary thyroid  cancer; personal history of pancreatitis or gallbladder disease. Recommend to check glucose at least daily. Emphasized the importance of monitoring blood sugars and symptoms of low or high blood sugar.    Follow Up Plan:  PharmD: 07/20/2024 PCP: 08/02/2024  Falling Bernette, PharmD PGY1 Pharmacy Resident  05/25/2024   Mliss Tarry Griffin, PharmD, BCACP, CPP Clinical Pharmacist, [facility]

## 2024-05-25 ENCOUNTER — Telehealth: Payer: Self-pay

## 2024-05-25 ENCOUNTER — Telehealth: Payer: Self-pay | Admitting: Pharmacist

## 2024-05-25 ENCOUNTER — Other Ambulatory Visit (INDEPENDENT_AMBULATORY_CARE_PROVIDER_SITE_OTHER): Admitting: Pharmacist

## 2024-05-25 DIAGNOSIS — E1169 Type 2 diabetes mellitus with other specified complication: Secondary | ICD-10-CM

## 2024-05-25 NOTE — Telephone Encounter (Signed)
 Please send me new Rybelsus  PAP 7mg  daily Thank you!

## 2024-05-25 NOTE — Telephone Encounter (Signed)
 Provided 1 bottle of Rybelsus  3 mg daily (30 days supply) and printed PAP form. Left both items at front desk for him to sign tomorrow. He states he will be coming in tomorrow around 8:30AM.   Woodie Jock, PharmD PGY1 Pharmacy Resident  05/25/2024

## 2024-05-26 ENCOUNTER — Telehealth: Payer: Self-pay

## 2024-05-26 ENCOUNTER — Other Ambulatory Visit (HOSPITAL_COMMUNITY): Payer: Self-pay

## 2024-05-26 NOTE — Progress Notes (Unsigned)
 Pharmacy Medication Assistance Program Note    05/26/2024  Patient ID: Patrick [name], male  DOB: 12-10-1945, 78 y.o.  MRN:  [id number]     05/26/2024  Outreach Medication Two  Manufacturer Medication Two Novo Nordisk  N[facility] Rybelsus   Dose of Ryblesus 7MG   Type of Journalist, newspaper to [employer]  Date Application Submitted to Manufacturer 05/26/2024    NEW   Provider/ RX pages faxed 06/02/24

## 2024-05-31 ENCOUNTER — Telehealth: Payer: Self-pay | Admitting: Pharmacist

## 2024-05-31 NOTE — Telephone Encounter (Signed)
 Assisted with Novo PAP enrollment online for Rybelsus  7mg  daily Will only be approved through the end of 2025

## 2024-06-10 ENCOUNTER — Ambulatory Visit: Admitting: Family Medicine

## 2024-06-17 ENCOUNTER — Telehealth: Payer: Self-pay | Admitting: Pharmacist

## 2024-06-17 NOTE — Telephone Encounter (Addendum)
 Unable to check blood sugar at home Offered patient a free BGM, however he said he was going to go to [employer] to purchase a [employer] BGM over the counter COUNSELING PROVIDED FOR RYBELSUS  7MG  DOUBLING UP ON 3MG  NOW to equal 6mg  daily Counseling provided to patient on how to separate Rybelsus   FROM THYROID /OTHER Medications and then waiting 30 min to eat food Rybelsus  patient assistance supply arrived to PCP clinic-->4 MONTH SUPPLY LEFT UP FRONT Patient to pick up 06/18/24  PharmD f/u scheduled 07/20/24  Mliss Tarry Griffin, PharmD, BCACP, CPP Clinical Pharmacist, [facility]

## 2024-06-18 ENCOUNTER — Ambulatory Visit

## 2024-07-01 ENCOUNTER — Telehealth: Payer: Self-pay

## 2024-07-01 ENCOUNTER — Other Ambulatory Visit (HOSPITAL_COMMUNITY): Payer: Self-pay

## 2024-07-01 NOTE — Telephone Encounter (Signed)
   Patient coming to office to p/u Novo meds, will have him sign re-enrollment application for Farxiga .

## 2024-07-01 NOTE — Telephone Encounter (Signed)
 PAP: RE-ENROLLMENT application for Farxiga  has been submitted to [employer] (AZ&Me), via fax.

## 2024-07-19 NOTE — Progress Notes (Unsigned)
 07/20/2024 Name: Patrick [name] MRN: [id number] DOB: 1946-02-08  Chief Complaint  Patient presents with   Diabetes    Patrick [name] is a 78 y.o. year old male who presented for a telephone visit.They were referred to the pharmacist by their PCP for assistance in managing diabetes and medication access.    Subjective: [name] presents today for a telephone visit for medication access and diabetes. He has picked up his 20-month supply of Rybelsus  and has received the first shipment of Rybelsus  from the manufacturer. He did buy OTC diabetic supplies; however, he has been unable to use them because he doesn't understand the lancing device. Will schedule an in-person follow-up to educate.   Counseled on the manufacturer pulling the patient assistance for Rybelsus  in 2026. He does have several months of medication in stock and will have enough to make it through the first couple of months of 2026. Will consider alternative options in the new year, possibly Januvia  if renal function remains stable.   Care Team: Primary Care Provider: Dettinger, Fonda LABOR, MD ; Next Scheduled Visit: 08/02/2024  Medication Access/Adherence Current Pharmacy[facility]  [phone] [place] [place] - [address] [place] [address] Phone: [phone] Fax: [phone]  [facility] - [place], [place] - [address] [place] [address] Phone: [phone] Fax: [phone]  Patient reports affordability concerns with their medications: Yes  - Farxiga  via AZ&Me, Novo Nordisk for Rybelsus   Patient reports access/transportation concerns to their pharmacy: No  Patient reports adherence concerns with their medications:  Yes  - barriers due to cost and needle aversion  Diabetes: Current medications: Farxiga  10 mg daily, Glimepiride  2 mg daily, Rybelsus  7 mg daily Medications tried in the past: metformin  (hx of AKI), Tradjenta  5 mg, glipizide Of note: patient has  a history of needle aversion, will not inject insulin  Statin: none, may benefit from initiation in the future, shared-decision making for benefit vs risks LDL of 61 on 01/28/24 ACEi/ARB: lisinopril  10 mg daily UACR of 210 on 04/22/24  A1c of 11.9% on 05/12/24, increased from 7% on 03/31/24  Current glucose readings:  Hasn't been able to check sugars because he doesn't know how to use the lancet Has OneTouch diabetic supplies Will schedule in-person follow-up for education  Patient denies hypoglycemic s/sx including dizziness, shakiness, sweating. Patient denies hyperglycemic symptoms including polyuria, polydipsia, polyphagia, nocturia, neuropathy, blurred vision.  Current meal patterns: Breakfast: bacon, eggs, sometimes home-fries Coffee (black), water  Lunch: chicken, fish, vegetables Supper: chicken, fish, vegetables (green beans, lima beans, black eyed peas, cabbage) Trends: has reduced sweet tea to once a month, increased water  intake   Current medication access support:  AZ&Me: Farxiga  10 mg daily Novo Nordisk PAP for Rybelsus  7 mg daily   Macrovascular and Microvascular Risk Reduction: Statin? no; no history of statin therapy; LDL of 61 mg/dL on 4/71/74; however, the patient has a history of T2DM and may benefit from statin initiation. 78YO, so shared decision making is recommended for therapy initiation. ACEi/ARB? yes (lisinopril ) Last urinary albumin/creatinine ratio:  Lab Results  Component Value Date   MICRALBCREAT 210 (H) 04/22/2024   MICRALBCREAT 357 (H) 10/03/2023   MICRALBCREAT 424 (H) 09/25/2022   MICRALBCREAT 24.2 12/23/2016  Last foot exam: 10/03/2023 Tobacco Use:  Tobacco Use: Low Risk  (05/12/2024)   Patient History    Smoking Tobacco Use: Never    Smokeless Tobacco Use: Never    Passive Exposure: Never  Objective: Lab Results  Component Value Date   HGBA1C 11.9 (H) 05/12/2024   Lab Results  Component Value Date   CREATININE 2.10 (H) 05/20/2024    BUN 34 (H) 05/20/2024   NA 133 (L) 05/20/2024   K 5.3 (H) 05/20/2024   CL 102 05/20/2024   CO2 17 (L) 05/20/2024   Lab Results  Component Value Date   CHOL 138 01/28/2024   HDL 40 01/28/2024   LDLCALC 61 01/28/2024   LDLDIRECT 67 12/23/2016   TRIG 225 (H) 01/28/2024   CHOLHDL 3.5 01/28/2024   Medications Reviewed Today     Reviewed by Bernette Falling, [facility] (Pharmacist) on 07/20/24 at [phone]  Med List Status: <None>   Medication Order Taking? Sig Documenting Provider Last Dose Status Informant  dapagliflozin  propanediol (FARXIGA ) 10 MG TABS tablet [id number] Yes Take 1 tablet (10 mg total) by mouth daily. Dettinger, Fonda LABOR, MD  Active   ferrous sulfate  (SV IRON ) 325 (65 FE) MG tablet [id number]  Take 1 tablet (325 mg total) by mouth daily. Dettinger, Fonda LABOR, MD  Active   glimepiride  (AMARYL ) 2 MG tablet [id number] Yes Take 1 tablet (2 mg total) by mouth daily before breakfast. Dettinger, Fonda LABOR, MD  Active   Lancets [facility] ULTRASOFT) lancets [id number]  1 each by Other route 2 (two) times daily. Use as instructed Dettinger, Fonda LABOR, MD  Active   levothyroxine  (SYNTHROID ) 137 MCG tablet [id number] Yes Take 1 tablet (137 mcg total) by mouth daily before breakfast. Dettinger, Fonda LABOR, MD  Active   lisinopril  (ZESTRIL ) 10 MG tablet [id number] Yes Take 1 tablet (10 mg total) by mouth daily. Dettinger, Fonda LABOR, MD  Active   Semaglutide  (RYBELSUS ) 7 MG TABS [id number] Yes Take 1 tablet (7 mg total) by mouth daily. Dettinger, Fonda LABOR, MD  Active            Med Note TENA, MLISS BIRCH   Fri Jun 18, 2024 10:41 AM) Via novo nordisk patient assistance program    Med List Note (Ward, Angelica, CPhT 03/31/24 1501): Medication was provided for confirmation            Assessment/Plan:  Diabetes: Currently uncontrolled. Goal of <7%. Recently approved for PAP through Novo Nordisk to receive Rybelsus . Counseled on the manufacturer pulling the program in 2026. He should have enough supply to  make it through 2-3 months of the new year. Will consider Januvia  PAP if renal function remains stable and an additional agent is needed after he is out of stock of Rybelsus . LDL at goal of <100 mg/dL without statin therapy BP at goal of <130/80 mmHg Reviewed long term cardiovascular and renal outcomes of uncontrolled blood sugar Reviewed goal A1c, goal fasting, and goal 2 hour post prandial glucose Recommend to:   Continue Farxiga  10 mg daily Continue glimepiride  2 mg daily Continue Rybelsus  7 mg daily Follow-up scheduled in-person to educate about diabetic supplies and proper use techniques. Currently, he is unable to check his sugars because he doesn't know how to work his lancing device. Denies any symptoms of hypo- or hyperglycemia.  Patient denies personal or family history of multiple endocrine neoplasia type 2, medullary thyroid  cancer; personal history of pancreatitis or gallbladder disease. Recommend to check glucose at least daily. Emphasized the importance of monitoring blood sugars and symptoms of low or high blood sugar.    Follow Up Plan:  PharmD: 07/27/2024 (in-person) PCP: 08/02/2024  Falling Bernette, PharmD PGY1 Pharmacy Resident  07/20/2024  Mliss Tarry Griffin, PharmD, BCACP,  CPP Clinical Pharmacist, [facility]

## 2024-07-20 ENCOUNTER — Other Ambulatory Visit (INDEPENDENT_AMBULATORY_CARE_PROVIDER_SITE_OTHER): Payer: Self-pay

## 2024-07-20 DIAGNOSIS — E1169 Type 2 diabetes mellitus with other specified complication: Secondary | ICD-10-CM

## 2024-07-23 NOTE — Progress Notes (Unsigned)
 07/27/2024 Name: Patrick [name] MRN: [id number] DOB: July 14, 1946  Chief Complaint  Patient presents with   Diabetes   Hypertension    Patrick [name] is a 78 y.o. year old male who presented for an in-person visit.They were referred to the pharmacist by their PCP for assistance in managing diabetes and medication access.    Subjective: Patrick [name] presents for a in-person visit today for diabetes supply education. Last week, he stated that he picked up new glucometer supplies, and he was unsure how to use the lancing device. Educated on proper use and had the patient conduct his own blood sugar reading. He was able to use the lancing device and glucometer; however, he does have difficulty with dexterity.  Care Team: Primary Care Provider: Dettinger, Fonda LABOR, MD ; Next Scheduled Visit: 08/02/2024  Medication Access/Adherence Current Pharmacy[facility]  [phone] [place], [place] - [address] [place] [address] Phone: [phone] Fax: [phone]  [facility] - [place], [place] - [address] [place] [address] Phone: [phone] Fax: [phone]  Patient reports affordability concerns with their medications: Yes  - Farxiga  via AZ&Me, Novo Nordisk for Rybelsus   Patient reports access/transportation concerns to their pharmacy: No  Patient reports adherence concerns with their medications:  Yes  - barriers due to cost and needle aversion  Diabetes: Current medications: Farxiga  10 mg daily, Glimepiride  2 mg daily, Rybelsus  7 mg daily Medications tried in the past: metformin  (hx of AKI), Tradjenta  5 mg, glipizide Of note: patient has a history of needle aversion, will not inject insulin  Statin: none, may benefit from initiation in the future, shared-decision making for benefit vs risks LDL of 61 on 01/28/24 ACEi/ARB: lisinopril  10 mg daily UACR of 210 on 04/22/24  A1c of 11.9% on 05/12/24, increased from 7% on  03/31/24  Current glucose readings: OneTouch [place]  198 mg/dL, just finished eating 30 minutes prior to appointment  Patient denies hypoglycemic s/sx including dizziness, shakiness, sweating. Patient denies hyperglycemic symptoms including polyuria, polydipsia, polyphagia, nocturia, neuropathy, blurred vision.  Current meal patterns: Breakfast: bacon, eggs, sometimes home-fries Coffee (black), water  Lunch: chicken, fish, vegetables Supper: chicken, fish, vegetables (green beans, lima beans, black eyed peas, cabbage) Trends: has reduced sweet tea to once a month, increased water  intake   Current medication access support:  AZ&Me: Farxiga  10 mg daily Novo Nordisk PAP for Rybelsus  7 mg daily   Macrovascular and Microvascular Risk Reduction: Statin? no; no history of statin therapy; LDL of 61 mg/dL on 4/71/74; however, the patient has a history of T2DM and may benefit from statin initiation. 78YO, so shared decision making is recommended for therapy initiation. ACEi/ARB? yes (lisinopril ) Last urinary albumin/creatinine ratio:  Lab Results  Component Value Date   MICRALBCREAT 210 (H) 04/22/2024   MICRALBCREAT 357 (H) 10/03/2023   MICRALBCREAT 424 (H) 09/25/2022   MICRALBCREAT 24.2 12/23/2016  Last foot exam: 10/03/2023 Tobacco Use:  Tobacco Use: Low Risk  (05/12/2024)   Patient History    Smoking Tobacco Use: Never    Smokeless Tobacco Use: Never    Passive Exposure: Never    Objective: Lab Results  Component Value Date   HGBA1C 11.9 (H) 05/12/2024   Lab Results  Component Value Date   CREATININE 2.10 (H) 05/20/2024   BUN 34 (H) 05/20/2024   NA 133 (L) 05/20/2024   K 5.3 (H) 05/20/2024   CL 102 05/20/2024   CO2 17 (L) 05/20/2024   Lab Results  Component Value Date   CHOL 138 01/28/2024   HDL 40 01/28/2024   LDLCALC 61 01/28/2024   LDLDIRECT 67 12/23/2016   TRIG 225 (H) 01/28/2024   CHOLHDL 3.5 01/28/2024   Medications Reviewed Today     Reviewed by Bernette Falling, [facility] (Pharmacist) on 07/27/24 at 0831  Med List Status: <None>   Medication Order Taking? Sig Documenting Provider Last Dose Status Informant  dapagliflozin  propanediol (FARXIGA ) 10 MG TABS tablet [id number] Yes Take 1 tablet (10 mg total) by mouth daily. Dettinger, Fonda LABOR, MD  Active   ferrous sulfate  (SV IRON ) 325 (65 FE) MG tablet [id number] Yes Take 1 tablet (325 mg total) by mouth daily. Dettinger, Fonda LABOR, MD  Active   glimepiride  (AMARYL ) 2 MG tablet [id number] Yes Take 1 tablet (2 mg total) by mouth daily before breakfast. Dettinger, Fonda LABOR, MD  Active   Lancets [facility] ULTRASOFT) lancets [id number] Yes 1 each by Other route 2 (two) times daily. Use as instructed Dettinger, Fonda LABOR, MD  Active   levothyroxine  (SYNTHROID ) 137 MCG tablet [id number] Yes Take 1 tablet (137 mcg total) by mouth daily before breakfast. Dettinger, Fonda LABOR, MD  Active   lisinopril  (ZESTRIL ) 10 MG tablet [id number] Yes Take 1 tablet (10 mg total) by mouth daily. Dettinger, Fonda LABOR, MD  Active   Semaglutide  (RYBELSUS ) 7 MG TABS [id number] Yes Take 1 tablet (7 mg total) by mouth daily. Dettinger, Fonda LABOR, MD  Active            Med Note TENA, MLISS BIRCH   Fri Jun 18, 2024 10:41 AM) Via novo nordisk patient assistance program    Med List Note (Ward, Angelica, CPhT 03/31/24 1501): Medication was provided for confirmation            Assessment/Plan:  Diabetes: Currently uncontrolled. Goal of <7%.  LDL at goal of <100 mg/dL without statin therapy BP at goal of <130/80 mmHg Today's reading of 116/79 Reviewed long term cardiovascular and renal outcomes of uncontrolled blood sugar Reviewed goal A1c, goal fasting, and goal 2 hour post prandial glucose Recommend to:   Continue Farxiga  10 mg daily Continue glimepiride  2 mg daily Continue Rybelsus  7 mg daily Recommend to check glucose at least daily. Emphasized the importance of monitoring blood sugars and symptoms of low or high blood sugar. Counseled  on keeping a log of daily blood sugars for review at upcoming visit.  Rx sent in for [facility] lancets   Follow Up Plan:  PCP: 08/02/2024 PharmD: 09/07/2024  Falling Bernette, PharmD PGY1 Pharmacy Resident  07/27/2024  Mliss Tarry Griffin, PharmD, BCACP, CPP Clinical Pharmacist, [facility]

## 2024-07-27 ENCOUNTER — Ambulatory Visit: Admitting: Pharmacist

## 2024-07-27 VITALS — BP 116/79

## 2024-07-27 DIAGNOSIS — I152 Hypertension secondary to endocrine disorders: Secondary | ICD-10-CM | POA: Diagnosis not present

## 2024-07-27 DIAGNOSIS — E1169 Type 2 diabetes mellitus with other specified complication: Secondary | ICD-10-CM | POA: Diagnosis not present

## 2024-07-27 DIAGNOSIS — E1159 Type 2 diabetes mellitus with other circulatory complications: Secondary | ICD-10-CM

## 2024-07-27 MED ORDER — ONETOUCH DELICA PLUS LANCET33G MISC
1.0000 | Freq: Every day | 11 refills | Status: AC
Start: 2024-07-27 — End: ?

## 2024-08-02 ENCOUNTER — Other Ambulatory Visit: Payer: Self-pay | Admitting: Family Medicine

## 2024-08-02 ENCOUNTER — Encounter: Payer: Self-pay | Admitting: Family Medicine

## 2024-08-02 ENCOUNTER — Ambulatory Visit: Payer: Self-pay | Admitting: Family Medicine

## 2024-08-02 VITALS — BP 134/74 | HR 71 | Ht 70.0 in | Wt 182.0 lb

## 2024-08-02 DIAGNOSIS — I152 Hypertension secondary to endocrine disorders: Secondary | ICD-10-CM

## 2024-08-02 DIAGNOSIS — E038 Other specified hypothyroidism: Secondary | ICD-10-CM | POA: Diagnosis not present

## 2024-08-02 DIAGNOSIS — Z7984 Long term (current) use of oral hypoglycemic drugs: Secondary | ICD-10-CM

## 2024-08-02 DIAGNOSIS — N1831 Chronic kidney disease, stage 3a: Secondary | ICD-10-CM | POA: Diagnosis not present

## 2024-08-02 DIAGNOSIS — E1169 Type 2 diabetes mellitus with other specified complication: Secondary | ICD-10-CM | POA: Diagnosis not present

## 2024-08-02 DIAGNOSIS — E1159 Type 2 diabetes mellitus with other circulatory complications: Secondary | ICD-10-CM

## 2024-08-02 LAB — LIPID PANEL

## 2024-08-02 LAB — BAYER DCA HB A1C WAIVED: HB A1C (BAYER DCA - WAIVED): 9.2 % — ABNORMAL HIGH (ref 4.8–5.6)

## 2024-08-02 MED ORDER — FERROUS SULFATE 325 (65 FE) MG PO TABS: 325.0000 mg | ORAL_TABLET | Freq: Every day | ORAL | 3 refills | Status: AC

## 2024-08-02 MED ORDER — DAPAGLIFLOZIN PROPANEDIOL 10 MG PO TABS: 10.0000 mg | ORAL_TABLET | Freq: Every day | ORAL | 3 refills | Status: DC

## 2024-08-02 MED ORDER — LEVOTHYROXINE SODIUM 137 MCG PO TABS: 137.0000 ug | ORAL_TABLET | Freq: Every day | ORAL | 3 refills | Status: DC

## 2024-08-02 MED ORDER — LISINOPRIL 10 MG PO TABS: 10.0000 mg | ORAL_TABLET | Freq: Every day | ORAL | 3 refills | Status: AC

## 2024-08-02 MED ORDER — GLIMEPIRIDE 2 MG PO TABS: 2.0000 mg | ORAL_TABLET | Freq: Every day | ORAL | 3 refills | Status: DC

## 2024-08-02 NOTE — Progress Notes (Signed)
 BP 134/74   Pulse 71   Ht 5' 10 (1.778 m)   Wt 182 lb (82.6 kg)   SpO2 97%   BMI 26.11 kg/m    Subjective:   Patient ID: Patrick [name], male    DOB: 02/04/1946, 78 y.o.   MRN: [id number]  HPI: Patrick [name] is a 78 y.o. male presenting on 08/02/2024 for Medical Management of Chronic Issues, Diabetes, and Chronic Kidney Disease   Discussed the use of AI scribe software for clinical note transcription with the patient, who gave verbal consent to proceed.  History of Present Illness   Patrick [name] is a 78 year old male with diabetes who presents for a recheck of his blood sugar levels.  Hyperglycemia - Morning blood glucose levels fluctuate between 136 to 166 mg/dL - Hemoglobin J8r today is 9.2% (previously 11%) - Currently taking Rybelsus , glimepiride , and Farxiga  for glycemic control - No adverse effects or issues with diabetes medications  Chronic kidney disease risk management - Taking lisinopril  for renal protection - No adverse effects or issues with antihypertensive therapy  Anemia - Taking an iron  supplement for anemia - Unclear if taking six pills refers to iron  or another medication          Relevant past medical, surgical, family and social history reviewed and updated as indicated. Interim medical history since our last visit reviewed. Allergies and medications reviewed and updated.  Review of Systems  Constitutional:  Negative for chills and fever.  Eyes:  Negative for visual disturbance.  Respiratory:  Negative for shortness of breath and wheezing.   Cardiovascular:  Negative for chest pain and leg swelling.  Skin:  Negative for rash.  Neurological:  Negative for dizziness and light-headedness.  All other systems reviewed and are negative.   Per HPI unless specifically indicated above   Allergies as of 08/02/2024       Reactions   Codeine Other (See Comments)   Excessive sweating, lightheadedness. Led to a hospitalization.   Metformin   And Related Other (See Comments)   AKI         Medication List        Accurate as of August 02, 2024 10:34 AM. If you have any questions, ask your nurse or doctor.          dapagliflozin  propanediol 10 MG Tabs tablet Commonly known as: FARXIGA  Take 1 tablet (10 mg total) by mouth daily.   ferrous sulfate  325 (65 FE) MG tablet Commonly known as: SV Iron  Take 1 tablet (325 mg total) by mouth daily.   glimepiride  2 MG tablet Commonly known as: AMARYL  Take 1 tablet (2 mg total) by mouth daily before breakfast.   levothyroxine  137 MCG tablet Commonly known as: SYNTHROID  Take 1 tablet (137 mcg total) by mouth daily before breakfast.   lisinopril  10 MG tablet Commonly known as: ZESTRIL  Take 1 tablet (10 mg total) by mouth daily.   OneTouch Delica Plus Lancet33G Misc 1-3 each by Does not apply route daily. DX: E11.9   Rybelsus  7 MG Tabs Generic drug: Semaglutide  Take 1 tablet (7 mg total) by mouth daily.         Objective:   BP 134/74   Pulse 71   Ht 5' 10 (1.778 m)   Wt 182 lb (82.6 kg)   SpO2 97%   BMI 26.11 kg/m   Wt Readings from Last 3 Encounters:  08/02/24 182 lb (82.6 kg)  05/12/24 184 lb (83.5 kg)  04/22/24 186 lb (84.4 kg)  Physical Exam Physical Exam   VITALS: BP- 134/74 NECK: Thyroid  normal, no nodules. CHEST: Lungs clear to auscultation bilaterally. CARDIOVASCULAR: Heart regular rate and rhythm.         Assessment & Plan:   Problem List Items Addressed This Visit       Cardiovascular and Mediastinum   Hypertension associated with diabetes (HCC)   Relevant Medications   dapagliflozin  propanediol (FARXIGA ) 10 MG TABS tablet   glimepiride  (AMARYL ) 2 MG tablet   lisinopril  (ZESTRIL ) 10 MG tablet     Endocrine   Type 2 diabetes mellitus with other specified complication (HCC) - Primary   Relevant Medications   dapagliflozin  propanediol (FARXIGA ) 10 MG TABS tablet   glimepiride  (AMARYL ) 2 MG tablet   lisinopril  (ZESTRIL ) 10 MG  tablet   Other Relevant Orders   CBC with Differential/Platelet   CMP14+EGFR   Lipid panel   Bayer DCA Hb A1c Waived   Hypothyroidism   Relevant Medications   levothyroxine  (SYNTHROID ) 137 MCG tablet     Genitourinary   CKD (chronic kidney disease), stage III (HCC)   Relevant Orders   CBC with Differential/Platelet   CMP14+EGFR   Lipid panel   Bayer DCA Hb A1c Waived       Type 2 diabetes mellitus Improved glycemic control. A1c decreased to 9.2 from 11.0. Morning glucose 136-166 mg/dL. - Continue Rybelsus , glimepiride , and Farxiga . - Patient is very resistant to starting insulin  which would be the next step at this point.  He wants to try 1 more 49-month cycle to get improvement on his own  Chronic kidney disease, stage 3a Blood pressure controlled at 134/74 mmHg. - Continue lisinopril .  Hypothyroidism - Continue current thyroid  medication.          Follow up plan: Return in about 3 months (around 10/31/2024), or if symptoms worsen or fail to improve, for Diabetes recheck.  Counseling provided for all of the vaccine components Orders Placed This Encounter  Procedures   CBC with Differential/Platelet   CMP14+EGFR   Lipid panel   Bayer DCA Hb A1c Waived    Fonda Levins, MD [facility] 08/02/2024, 10:34 AM

## 2024-08-03 LAB — CBC WITH DIFFERENTIAL/PLATELET
Basophils Absolute: 0.1 x10E3/uL (ref 0.0–0.2)
Basos: 1 %
EOS (ABSOLUTE): 0.4 x10E3/uL (ref 0.0–0.4)
Eos: 5 %
Hematocrit: 39.5 % (ref 37.5–51.0)
Hemoglobin: 11.9 g/dL — ABNORMAL LOW (ref 13.0–17.7)
Immature Grans (Abs): 0 x10E3/uL (ref 0.0–0.1)
Immature Granulocytes: 0 %
Lymphocytes Absolute: 1.3 x10E3/uL (ref 0.7–3.1)
Lymphs: 18 %
MCH: 29.3 pg (ref 26.6–33.0)
MCHC: 30.1 g/dL — ABNORMAL LOW (ref 31.5–35.7)
MCV: 97 fL (ref 79–97)
Monocytes Absolute: 0.6 x10E3/uL (ref 0.1–0.9)
Monocytes: 8 %
Neutrophils Absolute: 5 x10E3/uL (ref 1.4–7.0)
Neutrophils: 68 %
Platelets: 154 x10E3/uL (ref 150–450)
RBC: 4.06 x10E6/uL — ABNORMAL LOW (ref 4.14–5.80)
RDW: 11.9 % (ref 11.6–15.4)
WBC: 7.3 x10E3/uL (ref 3.4–10.8)

## 2024-08-03 LAB — CMP14+EGFR
ALT: 17 IU/L (ref 0–44)
AST: 15 IU/L (ref 0–40)
Albumin: 4 g/dL (ref 3.8–4.8)
Alkaline Phosphatase: 68 IU/L (ref 47–123)
BUN/Creatinine Ratio: 19 (ref 10–24)
BUN: 38 mg/dL — ABNORMAL HIGH (ref 8–27)
Bilirubin Total: 0.6 mg/dL (ref 0.0–1.2)
CO2: 22 mmol/L (ref 20–29)
Calcium: 9.9 mg/dL (ref 8.6–10.2)
Chloride: 104 mmol/L (ref 96–106)
Creatinine, Ser: 1.99 mg/dL — ABNORMAL HIGH (ref 0.76–1.27)
Globulin, Total: 2.8 g/dL (ref 1.5–4.5)
Glucose: 229 mg/dL — ABNORMAL HIGH (ref 70–99)
Potassium: 5.3 mmol/L — ABNORMAL HIGH (ref 3.5–5.2)
Sodium: 138 mmol/L (ref 134–144)
Total Protein: 6.8 g/dL (ref 6.0–8.5)
eGFR: 34 mL/min/1.73 — ABNORMAL LOW (ref >59)

## 2024-08-03 LAB — LIPID PANEL
Cholesterol, Total: 135 mg/dL (ref 100–199)
HDL: 39 mg/dL — AB (ref >39)
LDL CALC COMMENT:: 3.5 ratio (ref 0.0–5.0)
LDL Chol Calc (NIH): 78 mg/dL (ref 0–99)
Triglycerides: 95 mg/dL (ref 0–149)
VLDL Cholesterol Cal: 18 mg/dL (ref 5–40)

## 2024-08-06 ENCOUNTER — Ambulatory Visit: Payer: Self-pay | Admitting: Family Medicine

## 2024-08-09 ENCOUNTER — Telehealth: Payer: Self-pay

## 2024-09-06 ENCOUNTER — Telehealth: Payer: Self-pay | Admitting: Family Medicine

## 2024-09-06 NOTE — Telephone Encounter (Signed)
 Patient came in the office and cancel his appt with for f/u PAP for Rybelsus  and A1c. Please call to reschedule appt.

## 2024-09-07 ENCOUNTER — Other Ambulatory Visit

## 2024-09-15 ENCOUNTER — Other Ambulatory Visit

## 2024-09-16 ENCOUNTER — Ambulatory Visit (INDEPENDENT_AMBULATORY_CARE_PROVIDER_SITE_OTHER): Admitting: Pharmacist

## 2024-09-16 DIAGNOSIS — E1169 Type 2 diabetes mellitus with other specified complication: Secondary | ICD-10-CM | POA: Diagnosis not present

## 2024-09-16 DIAGNOSIS — Z7984 Long term (current) use of oral hypoglycemic drugs: Secondary | ICD-10-CM

## 2024-09-16 DIAGNOSIS — E119 Type 2 diabetes mellitus without complications: Secondary | ICD-10-CM | POA: Diagnosis not present

## 2024-09-16 DIAGNOSIS — N1832 Chronic kidney disease, stage 3b: Secondary | ICD-10-CM

## 2024-09-16 MED ORDER — DAPAGLIFLOZIN PROPANEDIOL 10 MG PO TABS: 10.0000 mg | ORAL_TABLET | Freq: Every day | ORAL | 3 refills | Status: AC

## 2024-09-16 MED ORDER — GLIMEPIRIDE 2 MG PO TABS: 2.0000 mg | ORAL_TABLET | Freq: Every day | ORAL | Status: AC

## 2024-09-16 NOTE — Progress Notes (Signed)
 "  09/16/2024 Name: Patrick [name] MRN: [id number] DOB: August 15, 1946  Chief Complaint  Patient presents with   Diabetes   Medication Assistance    Patrick [name] is a 79 y.o. year old male who presented for a clinic visit.  They were referred to the pharmacist by their PCP for assistance in managing diabetes and medication access.    Subjective: [name] presents today for a clinic visit for medication access and diabetes. He is testing his blood sugars at home in the morning before eating.  He is aware novo nordisk has ended patient assistance for Rybelsus .  He reports he has 5-8 months of Rybelsus  at home.  Patient declines injection, therefore we will have to go with Januvia  PO renally adjusted.   Care Team: Primary Care Provider: Dettinger, Fonda LABOR, MD ; Next Scheduled Visit: 10/2024  Medication Access/Adherence Current Pharmacy:  [id number] - [place], [place] - [address] [place] [address] Phone: [phone] Fax: [phone]  [facility] - [place], [place] - [address] [place] [address] Phone: [phone] Fax: [phone]  Patient reports affordability concerns with their medications: Yes  - Farxiga  via [employer]&Me, Patient reports access/transportation concerns to their pharmacy: No  Patient reports adherence concerns with their medications:  Yes  - barriers due to cost and needle aversion  Diabetes: Current medications: Farxiga  10 mg daily, Rybelsus  7 mg daily, GLIMEPIRIDE  Medications tried in the past: metformin  (hx of AKI), Tradjenta  5 mg, glipizide,  Of note: patient has a history of needle aversion, will not inject insulin  Statin: none, discussed initiation in the future, shared-decision making for benefit vs risks LDL of 78 on 08/02/2024 ACEi/ARB: lisinopril  10 mg daily UACR of 210 on 04/22/24  A1c of 11.9%-->9.2%  Current glucose readings 112-140 FBG:  Has OneTouch diabetic supplies  Patient denies hypoglycemic s/sx  including dizziness, shakiness, sweating. Patient denies hyperglycemic symptoms including polyuria, polydipsia, polyphagia, nocturia, neuropathy, blurred vision.  Current meal patterns: Breakfast: bacon, eggs, sometimes home-fries Coffee (black), water  Lunch: chicken, fish, vegetables Supper: chicken, fish, vegetables (green beans, lima beans, black eyed peas, cabbage) Trends: has reduced sweet tea to once a month, increased water  intake   Current medication access support:  AZ&Me: Farxiga  10 mg daily Novo Nordisk PAP for Rybelsus  7 mg daily (ENDING)  Macrovascular and Microvascular Risk Reduction: Statin? no; no history of statin therapy; LDL of 78 mg/dL on 87/8/74; however, the patient has a history of T2DM and may benefit from statin initiation. 78YO, so shared decision making is recommended for therapy initiation. ACEi/ARB? yes (lisinopril ) Last urinary albumin/creatinine ratio:  Lab Results  Component Value Date   MICRALBCREAT 210 (H) 04/22/2024   MICRALBCREAT 357 (H) 10/03/2023   MICRALBCREAT 424 (H) 09/25/2022   MICRALBCREAT 24.2 12/23/2016  Last foot exam: 10/03/2023 Tobacco Use:  Tobacco Use: Low Risk (08/02/2024)   Patient History    Smoking Tobacco Use: Never    Smokeless Tobacco Use: Never    Passive Exposure: Never    Objective: Lab Results  Component Value Date   HGBA1C 9.2 (H) 08/02/2024   Lab Results  Component Value Date   CREATININE 1.99 (H) 08/02/2024   BUN 38 (H) 08/02/2024   NA 138 08/02/2024   K 5.3 (H) 08/02/2024   CL 104 08/02/2024   CO2 22 08/02/2024   Lab Results  Component Value Date   CHOL 135 08/02/2024   HDL 39 (L) 08/02/2024   LDLCALC 78 08/02/2024  LDLDIRECT 67 12/23/2016   TRIG 95 08/02/2024   CHOLHDL 3.5 08/02/2024   Medications Reviewed Today     Reviewed by Billee Mliss BIRCH, RPH-CPP (Pharmacist) on 09/16/24 at [phone]  Med List Status: <None>   Medication Order Taking? Sig Documenting Provider Last Dose Status Informant   dapagliflozin  propanediol (FARXIGA ) 10 MG TABS tablet [id number]  Take 1 tablet (10 mg total) by mouth daily. Dettinger, Fonda LABOR, MD  Active   ferrous sulfate  (SV IRON ) 325 (65 FE) MG tablet [id number]  Take 1 tablet (325 mg total) by mouth daily. Dettinger, Fonda LABOR, MD  Active   glimepiride  (AMARYL ) 2 MG tablet [id number]  Take 1 tablet (2 mg total) by mouth daily before breakfast. Dettinger, Fonda LABOR, MD  Active   Lancets [facility] CATHRYNE PLUS [place]) MISC [id number]  1-3 each by Does not apply route daily. DX: E11.9 Dettinger, Fonda LABOR, MD  Active   levothyroxine  (SYNTHROID ) 137 MCG tablet [id number]  TAKE 1 TABLET BY MOUTH DAILY BEFORE BREAKFAST. Dettinger, Fonda LABOR, MD  Active   lisinopril  (ZESTRIL ) 10 MG tablet [id number]  Take 1 tablet (10 mg total) by mouth daily. Dettinger, Fonda LABOR, MD  Active   Semaglutide  (RYBELSUS ) 7 MG TABS [id number]  Take 1 tablet (7 mg total) by mouth daily. Dettinger, Fonda LABOR, MD  Active            Med Note TENA, MLISS BIRCH   Fri Jun 18, 2024 10:41 AM) Via novo nordisk patient assistance program    Med List Note (Ward, Angelica, CPhT 03/31/24 1501): Medication was provided for confirmation            Assessment/Plan:  Diabetes: Currently uncontrolled, but improved. Goal of <7%. Rybelsus  ending PAP LDL slightly at goal of <70 mg/dL without statin therapy BP at goal of <130/80 mmHg Reviewed long term cardiovascular and renal outcomes of uncontrolled blood sugar Reviewed goal A1c, goal fasting, and goal 2 hour post prandial glucose Recommend to:   Continue Farxiga  10 mg daily Continue glimepiride  2 mg daily Continue Rybelsus  7 mg daily until supply finished Will transition to Januvia  25-50mg  daily via [employer] patient assistance (paperwork complete) once Rybelsus  finished; prefer Trulicity, however patient respectfully declines injections Patient denies personal or family history of multiple endocrine neoplasia type 2, medullary thyroid  cancer; personal  history of pancreatitis or gallbladder disease. Recommend to check glucose at least daily. Emphasized the importance of monitoring blood sugars and symptoms of low or high blood sugar.    Follow Up Plan:  PharmD: 3 months PCP:  10/2024   Mliss Tarry Billee, PharmD, BCACP, CPP Clinical Pharmacist, [facility]    "

## 2024-11-01 ENCOUNTER — Ambulatory Visit: Admitting: Family Medicine

## 2024-11-16 ENCOUNTER — Other Ambulatory Visit

## 2025-04-12 ENCOUNTER — Ambulatory Visit: Payer: Self-pay
